# Patient Record
Sex: Male | Born: 1975 | Race: White | Hispanic: No | Marital: Married | Smoking: Former smoker
Health system: Southern US, Community
[De-identification: ages and names within clinical notes are randomized; demographics above are authoritative.]

## PROBLEM LIST (undated history)

## (undated) DIAGNOSIS — T7840XA Allergy, unspecified, initial encounter: Secondary | ICD-10-CM

## (undated) DIAGNOSIS — G4730 Sleep apnea, unspecified: Secondary | ICD-10-CM

## (undated) DIAGNOSIS — K259 Gastric ulcer, unspecified as acute or chronic, without hemorrhage or perforation: Secondary | ICD-10-CM

## (undated) DIAGNOSIS — I10 Essential (primary) hypertension: Secondary | ICD-10-CM

## (undated) DIAGNOSIS — E785 Hyperlipidemia, unspecified: Secondary | ICD-10-CM

## (undated) DIAGNOSIS — R7989 Other specified abnormal findings of blood chemistry: Secondary | ICD-10-CM

## (undated) DIAGNOSIS — J45909 Unspecified asthma, uncomplicated: Secondary | ICD-10-CM

## (undated) HISTORY — PX: BACK SURGERY: SHX140

## (undated) HISTORY — DX: Other specified abnormal findings of blood chemistry: R79.89

## (undated) HISTORY — DX: Gastric ulcer, unspecified as acute or chronic, without hemorrhage or perforation: K25.9

## (undated) HISTORY — DX: Hyperlipidemia, unspecified: E78.5

## (undated) HISTORY — DX: Unspecified asthma, uncomplicated: J45.909

## (undated) HISTORY — DX: Allergy, unspecified, initial encounter: T78.40XA

## (undated) HISTORY — DX: Sleep apnea, unspecified: G47.30

---

## 1999-09-03 ENCOUNTER — Ambulatory Visit (HOSPITAL_COMMUNITY): Admission: RE | Admit: 1999-09-03 | Discharge: 1999-09-03 | Payer: Self-pay | Admitting: Family Medicine

## 1999-09-03 ENCOUNTER — Encounter: Payer: Self-pay | Admitting: Family Medicine

## 1999-09-13 ENCOUNTER — Emergency Department (HOSPITAL_COMMUNITY): Admission: EM | Admit: 1999-09-13 | Discharge: 1999-09-13 | Payer: Self-pay | Admitting: Emergency Medicine

## 1999-09-24 ENCOUNTER — Emergency Department (HOSPITAL_COMMUNITY): Admission: EM | Admit: 1999-09-24 | Discharge: 1999-09-24 | Payer: Self-pay | Admitting: Emergency Medicine

## 1999-11-30 ENCOUNTER — Emergency Department (HOSPITAL_COMMUNITY): Admission: EM | Admit: 1999-11-30 | Discharge: 1999-12-01 | Payer: Self-pay | Admitting: Emergency Medicine

## 2000-04-15 ENCOUNTER — Encounter: Payer: Self-pay | Admitting: Emergency Medicine

## 2000-04-15 ENCOUNTER — Emergency Department (HOSPITAL_COMMUNITY): Admission: EM | Admit: 2000-04-15 | Discharge: 2000-04-15 | Payer: Self-pay | Admitting: Emergency Medicine

## 2000-05-25 ENCOUNTER — Encounter: Payer: Self-pay | Admitting: Emergency Medicine

## 2000-05-25 ENCOUNTER — Emergency Department (HOSPITAL_COMMUNITY): Admission: EM | Admit: 2000-05-25 | Discharge: 2000-05-25 | Payer: Self-pay | Admitting: Emergency Medicine

## 2003-04-16 ENCOUNTER — Inpatient Hospital Stay (HOSPITAL_COMMUNITY): Admission: RE | Admit: 2003-04-16 | Discharge: 2003-04-18 | Payer: Self-pay | Admitting: Neurosurgery

## 2006-04-17 ENCOUNTER — Encounter: Admission: RE | Admit: 2006-04-17 | Discharge: 2006-04-17 | Payer: Self-pay | Admitting: Sports Medicine

## 2006-04-27 ENCOUNTER — Encounter: Admission: RE | Admit: 2006-04-27 | Discharge: 2006-04-27 | Payer: Self-pay | Admitting: Sports Medicine

## 2007-09-14 ENCOUNTER — Encounter (INDEPENDENT_AMBULATORY_CARE_PROVIDER_SITE_OTHER): Payer: Self-pay | Admitting: *Deleted

## 2010-08-09 ENCOUNTER — Encounter: Payer: Self-pay | Admitting: Sports Medicine

## 2010-08-10 ENCOUNTER — Encounter: Payer: Self-pay | Admitting: Sports Medicine

## 2010-12-05 NOTE — Op Note (Signed)
NAME:  Carl Roberts, Carl Roberts                        ACCOUNT NO.:  [id number]   MEDICAL RECORD NO.:  [id number]                   PATIENT TYPE:  OIB   LOCATION:  3012                                 FACILITY:  [facility]   PHYSICIAN:  Cristi Loron, M.D.            DATE OF BIRTH:  October 04, 1975   DATE OF PROCEDURE:  04/16/2003  DATE OF DISCHARGE:                                 OPERATIVE REPORT   PREOPERATIVE DIAGNOSES:  Left L5-S1 herniated nucleus pulposus, spinal  stenosis, lumbar radiculopathy, and lumbago.   POSTOPERATIVE DIAGNOSES:  Left L5-S1 herniated nucleus pulposus, spinal  stenosis, lumbar radiculopathy, and lumbago.   PROCEDURE:  Left L5-S1 microdiskectomy using microdissection.   SURGEON:  Cristi Loron, M.D.   ASSISTANT:  Clydene Fake, M.D.   ANESTHESIA:  General endotracheal.   ESTIMATED BLOOD LOSS:  50 mL.   SPECIMENS:  None.   DRAINS:  None.   COMPLICATIONS:  None.   BRIEF HISTORY:  The patient is a 35 year old white male who was injured in a  work-related injury, suffering back and left leg pain.  He failed medical  management and was worked up with a lumbar MRI, which demonstrated a  herniated disk at L5-S1 on the right.  The patient's signs, symptoms, and  physical exam were consistent with a left S1 radiculopathy.  I discussed the  various treatment options with him, including surgery.  The patient weighed  the risks, benefits, and alternatives of surgery and decided to proceed with  a microdiskectomy.   DESCRIPTION OF PROCEDURE:  The patient was brought to the operating room by  the anesthesia team.  General endotracheal anesthesia was induced.  The  patient was turned to the prone position on the Wilson frame.  His  lumbosacral region was then shaved and prepared with Betadine scrub and  Betadine solution and sterile drapes were applied.  I then injected the area  to be incised with Marcaine with epinephrine solution and then made a linear  midline incision over the L5-S1 interspace.  I used electrocautery to  dissect down to the thoracolumbar fascia and divided the fascia just to the  left of midline, performing a left-sided subperiosteal dissection, stripping  the paraspinous musculature from the left spinous process and lamina of L5  and S1.  I inserted the McCullough retractor for exposure and obtained the  intraoperative radiograph to confirm our location.   We then brought the operating microscope into the field and under its  magnification and illumination completed the microdissection/decompression.  We used the high-speed drill to perform a left L5 laminotomy.  We widened  the laminotomy with the Kerrison punch and removed the left L5-S1 ligamentum  flavum.  We performed a foraminotomy about the left S1 nerve root.  We used  microdissection to free up the nerve root and thecal sac from the epidural  tissue and then Dr. Phoebe Perch gently retracted the nerve root medially  with the  D'Errico retractor.  This exposed a large underlying herniated disk at L5-  S1.  I incised into the herniated disk with the 15 blade scalpel and  performed a partial diskectomy using the pituitary forceps and the Epstein  curettes.  After we were satisfied with the diskectomy and removal of the  herniated part of the disk, we then palpated along the ventral surface of  the thecal sac along the exit route of the S1 nerve root and noted it was  well-decompressed.  We then obtained stringent hemostasis using bipolar  electrocautery and copiously irrigated the wound out with bacitracin  solution, removed the solution, then removed the McCullough retractor and  then reapproximated the patient's thoracolumbar fascia with interrupted #1  Vicryl suture, the subcutaneous tissue with interrupted 2-0 Vicryl suture,  and the skin with Steri-Strips and Benzoin.  The wound was then coated with  bacitracin ointment, a sterile dressing was applied, and the  drapes were  removed.  The patient was subsequently returned to a supine position, where  he was extubated by the anesthesia team and transported to the  postanesthesia care unit in stable condition.  All sponge, instrument, and  needle counts were correct at the end of this case.                                               Cristi Loron, M.D.    JDJ/MEDQ  D:  04/16/2003  T:  04/16/2003  Job:  [id number]

## 2011-04-17 DIAGNOSIS — J45909 Unspecified asthma, uncomplicated: Secondary | ICD-10-CM | POA: Insufficient documentation

## 2011-10-26 DIAGNOSIS — D352 Benign neoplasm of pituitary gland: Secondary | ICD-10-CM | POA: Insufficient documentation

## 2011-10-26 HISTORY — DX: Benign neoplasm of pituitary gland: D35.2

## 2011-10-27 DIAGNOSIS — Z9989 Dependence on other enabling machines and devices: Secondary | ICD-10-CM | POA: Insufficient documentation

## 2011-10-27 DIAGNOSIS — G4733 Obstructive sleep apnea (adult) (pediatric): Secondary | ICD-10-CM | POA: Insufficient documentation

## 2015-01-28 ENCOUNTER — Other Ambulatory Visit: Payer: Self-pay | Admitting: Family Medicine

## 2015-01-28 DIAGNOSIS — R1012 Left upper quadrant pain: Secondary | ICD-10-CM

## 2015-02-01 ENCOUNTER — Ambulatory Visit: Payer: Self-pay

## 2015-02-01 ENCOUNTER — Other Ambulatory Visit: Payer: Self-pay | Admitting: Family Medicine

## 2015-02-01 DIAGNOSIS — R1011 Right upper quadrant pain: Secondary | ICD-10-CM

## 2015-02-04 ENCOUNTER — Ambulatory Visit
Admission: RE | Admit: 2015-02-04 | Discharge: 2015-02-04 | Disposition: A | Discharge status: Home / Self Care | Payer: BLUE CROSS/BLUE SHIELD | Source: Ambulatory Visit | Attending: Family Medicine | Admitting: Family Medicine

## 2015-02-04 DIAGNOSIS — R1011 Right upper quadrant pain: Secondary | ICD-10-CM

## 2015-02-04 DIAGNOSIS — K760 Fatty (change of) liver, not elsewhere classified: Secondary | ICD-10-CM | POA: Insufficient documentation

## 2016-09-07 ENCOUNTER — Encounter: Payer: Self-pay | Admitting: Emergency Medicine

## 2016-09-07 ENCOUNTER — Emergency Department
Admission: EM | Admit: 2016-09-07 | Discharge: 2016-09-07 | Disposition: A | Discharge status: Home / Self Care | Payer: BLUE CROSS/BLUE SHIELD | Attending: Emergency Medicine | Admitting: Emergency Medicine

## 2016-09-07 ENCOUNTER — Emergency Department: Payer: BLUE CROSS/BLUE SHIELD

## 2016-09-07 DIAGNOSIS — I10 Essential (primary) hypertension: Secondary | ICD-10-CM | POA: Diagnosis not present

## 2016-09-07 DIAGNOSIS — R079 Chest pain, unspecified: Secondary | ICD-10-CM

## 2016-09-07 DIAGNOSIS — Z79899 Other long term (current) drug therapy: Secondary | ICD-10-CM | POA: Diagnosis not present

## 2016-09-07 DIAGNOSIS — R202 Paresthesia of skin: Secondary | ICD-10-CM | POA: Insufficient documentation

## 2016-09-07 DIAGNOSIS — R0789 Other chest pain: Secondary | ICD-10-CM | POA: Insufficient documentation

## 2016-09-07 HISTORY — DX: Essential (primary) hypertension: I10

## 2016-09-07 LAB — BASIC METABOLIC PANEL
ANION GAP: 7 (ref 5–15)
BUN: 14 mg/dL (ref 6–20)
CO2: 26 mmol/L (ref 22–32)
Calcium: 9.1 mg/dL (ref 8.9–10.3)
Chloride: 105 mmol/L (ref 101–111)
Creatinine, Ser: 0.78 mg/dL (ref 0.61–1.24)
Glucose, Bld: 87 mg/dL (ref 65–99)
POTASSIUM: 4 mmol/L (ref 3.5–5.1)
SODIUM: 138 mmol/L (ref 135–145)

## 2016-09-07 LAB — CBC
HCT: 43.7 % (ref 40.0–52.0)
Hemoglobin: 14.9 g/dL (ref 13.0–18.0)
MCH: 31.4 pg (ref 26.0–34.0)
MCHC: 34.1 g/dL (ref 32.0–36.0)
MCV: 91.9 fL (ref 80.0–100.0)
PLATELETS: 235 10*3/uL (ref 150–440)
RBC: 4.76 MIL/uL (ref 4.40–5.90)
RDW: 13.9 % (ref 11.5–14.5)
WBC: 5.9 10*3/uL (ref 3.8–10.6)

## 2016-09-07 LAB — TROPONIN I

## 2016-09-07 MED ORDER — NITROGLYCERIN 0.4 MG SL SUBL
SUBLINGUAL_TABLET | SUBLINGUAL | Status: AC
  Filled 2016-09-07: qty 1

## 2016-09-07 MED ORDER — ASPIRIN 81 MG PO CHEW
CHEWABLE_TABLET | ORAL | Status: AC
  Filled 2016-09-07: qty 4

## 2016-09-07 MED ORDER — ASPIRIN 81 MG PO CHEW
324.0000 mg | CHEWABLE_TABLET | Freq: Once | ORAL | Status: AC
  Administered 2016-09-07: 324 mg via ORAL

## 2016-09-07 MED ORDER — NITROGLYCERIN 0.4 MG SL SUBL
0.4000 mg | SUBLINGUAL_TABLET | SUBLINGUAL | Status: DC | PRN
  Administered 2016-09-07: 0.4 mg via SUBLINGUAL

## 2016-09-07 MED ORDER — SODIUM CHLORIDE 0.9 % IV BOLUS (SEPSIS)
1000.0000 mL | Freq: Once | INTRAVENOUS | Status: AC
  Administered 2016-09-07: 1000 mL via INTRAVENOUS

## 2016-09-07 NOTE — ED Triage Notes (Addendum)
Pt presents to ED with sudden onset of mid sternal chest pain that radiates to his left arm since around 0530 this morning. No hx of the same. Denies nasuea and reports slight sob. Pt skin warm and dry. No increased work of breathing or acute distress noted at this time.

## 2016-09-07 NOTE — ED Provider Notes (Signed)
[facility] Emergency Department Provider Note  ____________________________________________  Time seen: Approximately 8:22 AM  I have reviewed the triage vital signs and the nursing notes.   HISTORY  Chief Complaint Chest Pain and Arm Pain   HPI Carl Roberts is a 42 y.o. male a history of hypertension presents for evaluation of chest pain. Patient reports that he got in the shower at 5 AM this morning when he started having left arm tingling and then developed pressure on his chest that he describes as mild 4/10, it is centrally nonradiating. Patient denies shortness of breath, dizziness, URI symptoms, nausea, vomiting. Patient reports that he works out frequently and usually does not have chest pain however yesterday when he was running he felt more out of breath than normal. He has family history of ischemic heart disease in his maternal side. Has history of former smoking. Patient does not take any medications at home as he has been cleared from his hypertension after losing some weight.  Past Medical History:  Diagnosis Date  . Hypertension     There are no active problems to display for this patient.   Past Surgical History:  Procedure Laterality Date  . BACK SURGERY      Prior to Admission medications   Medication Sig Start Date End Date Taking? Authorizing Provider  testosterone cypionate (DEPOTESTOSTERONE CYPIONATE) 200 MG/ML injection Inject 200 mg into the muscle every 14 (fourteen) days. 12/25/15  Yes Historical Provider, MD    Allergies Shellfish allergy  No family history on file.  Social History Social History  Substance Use Topics  . Smoking status: Never Smoker  . Smokeless tobacco: Never Used  . Alcohol use Yes    Review of Systems  Constitutional: Negative for fever. Eyes: Negative for visual changes. ENT: Negative for sore throat. Neck: No neck pain  Cardiovascular: +chest pain. Respiratory: Negative for shortness  of breath. Gastrointestinal: Negative for abdominal pain, vomiting or diarrhea. Genitourinary: Negative for dysuria. Musculoskeletal: Negative for back pain. + L arm tingling Skin: Negative for rash. Neurological: Negative for headaches, weakness or numbness. Psych: No SI or HI  ____________________________________________   PHYSICAL EXAM:  VITAL SIGNS: ED Triage Vitals  Enc Vitals Group     BP 09/07/16 0704 138/87     Pulse Rate 09/07/16 0704 (!) 53     Resp 09/07/16 0704 18     Temp 09/07/16 0704 98 F (36.7 C)     Temp Source 09/07/16 0704 Oral     SpO2 09/07/16 0704 100 %     Weight 09/07/16 0702 222 lb (100.7 kg)     Height 09/07/16 0702 6\' 2"  (1.88 m)     Head Circumference --      Peak Flow --      Pain Score 09/07/16 0702 0     Pain Loc --      Pain Edu? --      Excl. in [place]? --     Constitutional: Alert and oriented. Well appearing and in no apparent distress. HEENT:      Head: Normocephalic and atraumatic.         Eyes: Conjunctivae are normal. Sclera is non-icteric. EOMI. PERRL      Mouth/Throat: Mucous membranes are moist.       Neck: Supple with no signs of meningismus. Cardiovascular: Regular rate and rhythm. No murmurs, gallops, or rubs. 2+ symmetrical distal pulses are present in all extremities. No JVD. Respiratory: Normal respiratory effort. Lungs are clear  to auscultation bilaterally. No wheezes, crackles, or rhonchi.  Gastrointestinal: Soft, non tender, and non distended with positive bowel sounds. No rebound or guarding. Genitourinary: No CVA tenderness. Musculoskeletal: Nontender with normal range of motion in all extremities. No edema, cyanosis, or erythema of extremities. Neurologic: Normal speech and language. Face is symmetric. Moving all extremities. No gross focal neurologic deficits are appreciated. Skin: Skin is warm, dry and intact. No rash noted. Psychiatric: Mood and affect are normal. Speech and behavior are  normal.  ____________________________________________   LABS (all labs ordered are listed, but only abnormal results are displayed)  Labs Reviewed  CBC  BASIC METABOLIC PANEL  TROPONIN I  TROPONIN I   ____________________________________________  EKG  ED ECG REPORT I, Rudene Re, the attending physician, personally viewed and interpreted this ECG.  Sinus bradycardia, rate of 51, normal intervals, normal axis, no ST elevations or depressions.  ____________________________________________  RADIOLOGY  CXR: negative ____________________________________________   PROCEDURES  Procedure(s) performed: None Procedures Critical Care performed:  None ____________________________________________   INITIAL IMPRESSION / ASSESSMENT AND PLAN / ED COURSE  41 y.o. male a history of hypertension presents for evaluation of chest pressure 88 into his left arm that has been present since 5 AM this morning.  Chest pain in a 41 y.o. male with low suspicion for cardiac (HEART score 3) or other serious etiology (including aortic dissection, pneumonia, pneumothorax, or pulmonary embolism) based his history and physical exam in the ED today. EKG normal. Plan for labs including CBC, chemistries and troponin now and in 3 hours, CXR and re-evaluation for disposition. Will give full dose ASA and nitroglycerine. Will observe patient on cardiac monitor while in the ED and pain control.       _________________________ 12:41 PM on 09/07/2016 -----------------------------------------  Patient remained pain-free in the emergency department after receiving one sublingual nitroglycerin. Troponin 2 is negative. I was able to schedule an appointment with [facility], Cardiologist on call tomorrow at 9:30AM For close follow-up. Recommended the patient returns to the emergency room if the chest pain recurs. Patient is comfortable with the plan and will follow-up tomorrow. Patient be discharged at this  time.  Pertinent labs & imaging results that were available during my care of the patient were reviewed by me and considered in my medical decision making (see chart for details).    ____________________________________________   FINAL CLINICAL IMPRESSION(S) / ED DIAGNOSES  Final diagnoses:  Chest pain, unspecified type      NEW MEDICATIONS STARTED DURING THIS VISIT:  New Prescriptions   No medications on file     Note:  This document was prepared using Dragon voice recognition software and may include unintentional dictation errors.    Rudene Re, MD 09/07/16 1242

## 2016-09-07 NOTE — Discharge Instructions (Signed)

## 2016-09-08 DIAGNOSIS — E782 Mixed hyperlipidemia: Secondary | ICD-10-CM | POA: Insufficient documentation

## 2017-01-07 DIAGNOSIS — M9902 Segmental and somatic dysfunction of thoracic region: Secondary | ICD-10-CM | POA: Diagnosis not present

## 2017-01-07 DIAGNOSIS — M9903 Segmental and somatic dysfunction of lumbar region: Secondary | ICD-10-CM | POA: Diagnosis not present

## 2017-01-07 DIAGNOSIS — M546 Pain in thoracic spine: Secondary | ICD-10-CM | POA: Diagnosis not present

## 2017-01-07 DIAGNOSIS — M545 Low back pain: Secondary | ICD-10-CM | POA: Diagnosis not present

## 2017-05-10 DIAGNOSIS — M9903 Segmental and somatic dysfunction of lumbar region: Secondary | ICD-10-CM | POA: Diagnosis not present

## 2017-05-10 DIAGNOSIS — M9902 Segmental and somatic dysfunction of thoracic region: Secondary | ICD-10-CM | POA: Diagnosis not present

## 2017-05-10 DIAGNOSIS — M545 Low back pain: Secondary | ICD-10-CM | POA: Diagnosis not present

## 2017-05-10 DIAGNOSIS — M546 Pain in thoracic spine: Secondary | ICD-10-CM | POA: Diagnosis not present

## 2017-05-31 DIAGNOSIS — M5416 Radiculopathy, lumbar region: Secondary | ICD-10-CM | POA: Diagnosis not present

## 2017-05-31 DIAGNOSIS — M9905 Segmental and somatic dysfunction of pelvic region: Secondary | ICD-10-CM | POA: Diagnosis not present

## 2017-05-31 DIAGNOSIS — M9903 Segmental and somatic dysfunction of lumbar region: Secondary | ICD-10-CM | POA: Diagnosis not present

## 2017-05-31 DIAGNOSIS — M62830 Muscle spasm of back: Secondary | ICD-10-CM | POA: Diagnosis not present

## 2017-06-02 DIAGNOSIS — M9905 Segmental and somatic dysfunction of pelvic region: Secondary | ICD-10-CM | POA: Diagnosis not present

## 2017-06-02 DIAGNOSIS — M9903 Segmental and somatic dysfunction of lumbar region: Secondary | ICD-10-CM | POA: Diagnosis not present

## 2017-06-02 DIAGNOSIS — M62830 Muscle spasm of back: Secondary | ICD-10-CM | POA: Diagnosis not present

## 2017-06-02 DIAGNOSIS — M5416 Radiculopathy, lumbar region: Secondary | ICD-10-CM | POA: Diagnosis not present

## 2017-06-17 DIAGNOSIS — M5416 Radiculopathy, lumbar region: Secondary | ICD-10-CM | POA: Diagnosis not present

## 2017-06-17 DIAGNOSIS — M62830 Muscle spasm of back: Secondary | ICD-10-CM | POA: Diagnosis not present

## 2017-06-17 DIAGNOSIS — M9903 Segmental and somatic dysfunction of lumbar region: Secondary | ICD-10-CM | POA: Diagnosis not present

## 2017-06-17 DIAGNOSIS — M9905 Segmental and somatic dysfunction of pelvic region: Secondary | ICD-10-CM | POA: Diagnosis not present

## 2017-07-06 DIAGNOSIS — Z23 Encounter for immunization: Secondary | ICD-10-CM | POA: Diagnosis not present

## 2017-07-06 DIAGNOSIS — E291 Testicular hypofunction: Secondary | ICD-10-CM | POA: Diagnosis not present

## 2017-10-06 DIAGNOSIS — M6289 Other specified disorders of muscle: Secondary | ICD-10-CM | POA: Diagnosis not present

## 2017-11-16 DIAGNOSIS — Z125 Encounter for screening for malignant neoplasm of prostate: Secondary | ICD-10-CM | POA: Diagnosis not present

## 2017-11-16 DIAGNOSIS — Z91013 Allergy to seafood: Secondary | ICD-10-CM | POA: Diagnosis not present

## 2017-11-16 DIAGNOSIS — Z0000 Encounter for general adult medical examination without abnormal findings: Secondary | ICD-10-CM | POA: Diagnosis not present

## 2017-11-16 DIAGNOSIS — E291 Testicular hypofunction: Secondary | ICD-10-CM | POA: Diagnosis not present

## 2017-11-16 LAB — LIPID PANEL
Cholesterol: 187 (ref 0–200)
HDL: 119 — AB (ref 35–70)
LDL Cholesterol: 122

## 2017-11-30 DIAGNOSIS — H5213 Myopia, bilateral: Secondary | ICD-10-CM | POA: Diagnosis not present

## 2018-03-22 DIAGNOSIS — S40862A Insect bite (nonvenomous) of left upper arm, initial encounter: Secondary | ICD-10-CM | POA: Diagnosis not present

## 2018-03-22 DIAGNOSIS — W57XXXA Bitten or stung by nonvenomous insect and other nonvenomous arthropods, initial encounter: Secondary | ICD-10-CM | POA: Diagnosis not present

## 2018-03-28 DIAGNOSIS — S60562A Insect bite (nonvenomous) of left hand, initial encounter: Secondary | ICD-10-CM | POA: Diagnosis not present

## 2018-03-28 DIAGNOSIS — S00462A Insect bite (nonvenomous) of left ear, initial encounter: Secondary | ICD-10-CM | POA: Diagnosis not present

## 2018-03-28 DIAGNOSIS — R21 Rash and other nonspecific skin eruption: Secondary | ICD-10-CM | POA: Diagnosis not present

## 2018-03-28 DIAGNOSIS — W57XXXA Bitten or stung by nonvenomous insect and other nonvenomous arthropods, initial encounter: Secondary | ICD-10-CM | POA: Diagnosis not present

## 2018-07-28 ENCOUNTER — Ambulatory Visit: Payer: BLUE CROSS/BLUE SHIELD | Admitting: Family Medicine

## 2018-08-03 ENCOUNTER — Ambulatory Visit (INDEPENDENT_AMBULATORY_CARE_PROVIDER_SITE_OTHER): Payer: BLUE CROSS/BLUE SHIELD | Admitting: Family Medicine

## 2018-08-03 ENCOUNTER — Encounter: Payer: Self-pay | Admitting: Family Medicine

## 2018-08-03 VITALS — BP 148/94 | HR 67 | Temp 97.6°F | Ht 72.25 in | Wt 278.0 lb

## 2018-08-03 DIAGNOSIS — G4733 Obstructive sleep apnea (adult) (pediatric): Secondary | ICD-10-CM

## 2018-08-03 DIAGNOSIS — Z23 Encounter for immunization: Secondary | ICD-10-CM | POA: Diagnosis not present

## 2018-08-03 DIAGNOSIS — Z9989 Dependence on other enabling machines and devices: Secondary | ICD-10-CM

## 2018-08-03 DIAGNOSIS — R030 Elevated blood-pressure reading, without diagnosis of hypertension: Secondary | ICD-10-CM | POA: Diagnosis not present

## 2018-08-03 DIAGNOSIS — E291 Testicular hypofunction: Secondary | ICD-10-CM | POA: Diagnosis not present

## 2018-08-03 MED ORDER — TESTOSTERONE CYPIONATE 200 MG/ML IM SOLN: 200.0000 mg | INTRAMUSCULAR | 1 refills | Status: DC

## 2018-08-03 NOTE — Patient Instructions (Addendum)
# Elevated Blood pressure - check your blood pressure at home a 1-2 times a week - start the DASH diet - Make sure you have been sitting for at least 5 minutes with feet flat on the ground    DASH Eating Plan DASH stands for "Dietary Approaches to Stop Hypertension." The DASH eating plan is a healthy eating plan that has been shown to reduce high blood pressure (hypertension). It may also reduce your risk for type 2 diabetes, heart disease, and stroke. The DASH eating plan may also help with weight loss. What are tips for following this plan?  General guidelines  Avoid eating more than 2,300 mg (milligrams) of salt (sodium) a day. If you have hypertension, you may need to reduce your sodium intake to 1,500 mg a day.  Limit alcohol intake to no more than 1 drink a day for nonpregnant women and 2 drinks a day for men. One drink equals 12 oz of beer, 5 oz of wine, or 1 oz of hard liquor.  Work with your health care provider to maintain a healthy body weight or to lose weight. Ask what an ideal weight is for you.  Get at least 30 minutes of exercise that causes your heart to beat faster (aerobic exercise) most days of the week. Activities may include walking, swimming, or biking.  Work with your health care provider or diet and nutrition specialist (dietitian) to adjust your eating plan to your individual calorie needs. Reading food labels   Check food labels for the amount of sodium per serving. Choose foods with less than 5 percent of the Daily Value of sodium. Generally, foods with less than 300 mg of sodium per serving fit into this eating plan.  To find whole grains, look for the word "whole" as the first word in the ingredient list. Shopping  Buy products labeled as "low-sodium" or "no salt added."  Buy fresh foods. Avoid canned foods and premade or frozen meals. Cooking  Avoid adding salt when cooking. Use salt-free seasonings or herbs instead of table salt or sea salt. Check  with your health care provider or pharmacist before using salt substitutes.  Do not fry foods. Cook foods using healthy methods such as baking, boiling, grilling, and broiling instead.  Cook with heart-healthy oils, such as olive, canola, soybean, or sunflower oil. Meal planning  Eat a balanced diet that includes: ? 5 or more servings of fruits and vegetables each day. At each meal, try to fill half of your plate with fruits and vegetables. ? Up to 6-8 servings of whole grains each day. ? Less than 6 oz of lean meat, poultry, or fish each day. A 3-oz serving of meat is about the same size as a deck of cards. One egg equals 1 oz. ? 2 servings of low-fat dairy each day. ? A serving of nuts, seeds, or beans 5 times each week. ? Heart-healthy fats. Healthy fats called Omega-3 fatty acids are found in foods such as flaxseeds and coldwater fish, like sardines, salmon, and mackerel.  Limit how much you eat of the following: ? Canned or prepackaged foods. ? Food that is high in trans fat, such as fried foods. ? Food that is high in saturated fat, such as fatty meat. ? Sweets, desserts, sugary drinks, and other foods with added sugar. ? Full-fat dairy products.  Do not salt foods before eating.  Try to eat at least 2 vegetarian meals each week.  Eat more home-cooked food and less restaurant,  buffet, and fast food.  When eating at a restaurant, ask that your food be prepared with less salt or no salt, if possible. What foods are recommended? The items listed may not be a complete list. Talk with your dietitian about what dietary choices are best for you. Grains Whole-grain or whole-wheat bread. Whole-grain or whole-wheat pasta. Brown rice. [place] Morrow. Bulgur. Whole-grain and low-sodium cereals. Pita bread. Low-fat, low-sodium crackers. Whole-wheat flour tortillas. Vegetables Fresh or frozen vegetables (raw, steamed, roasted, or grilled). Low-sodium or reduced-sodium tomato and vegetable  juice. Low-sodium or reduced-sodium tomato sauce and tomato paste. Low-sodium or reduced-sodium canned vegetables. Fruits All fresh, dried, or frozen fruit. Canned fruit in natural juice (without added sugar). Meat and other protein foods Skinless chicken or [place]. Ground chicken or [place]. Pork with fat trimmed off. Fish and seafood. Egg whites. Dried beans, peas, or lentils. Unsalted nuts, nut butters, and seeds. Unsalted canned beans. Lean cuts of beef with fat trimmed off. Low-sodium, lean deli meat. Dairy Low-fat (1%) or fat-free (skim) milk. Fat-free, low-fat, or reduced-fat cheeses. Nonfat, low-sodium ricotta or cottage cheese. Low-fat or nonfat yogurt. Low-fat, low-sodium cheese. Fats and oils Soft margarine without trans fats. Vegetable oil. Low-fat, reduced-fat, or light mayonnaise and salad dressings (reduced-sodium). Canola, safflower, olive, soybean, and sunflower oils. Avocado. Seasoning and other foods Herbs. Spices. Seasoning mixes without salt. Unsalted popcorn and pretzels. Fat-free sweets. What foods are not recommended? The items listed may not be a complete list. Talk with your dietitian about what dietary choices are best for you. Grains Baked goods made with fat, such as croissants, muffins, or some breads. Dry pasta or rice meal packs. Vegetables Creamed or fried vegetables. Vegetables in a cheese sauce. Regular canned vegetables (not low-sodium or reduced-sodium). Regular canned tomato sauce and paste (not low-sodium or reduced-sodium). Regular tomato and vegetable juice (not low-sodium or reduced-sodium). Angie Fava. Olives. Fruits Canned fruit in a light or heavy syrup. Fried fruit. Fruit in cream or butter sauce. Meat and other protein foods Fatty cuts of meat. Ribs. Fried meat. Berniece Salines. Sausage. Bologna and other processed lunch meats. Salami. Fatback. Hotdogs. Bratwurst. Salted nuts and seeds. Canned beans with added salt. Canned or smoked fish. Whole eggs or egg yolks.  Chicken or [place] with skin. Dairy Whole or 2% milk, cream, and half-and-half. Whole or full-fat cream cheese. Whole-fat or sweetened yogurt. Full-fat cheese. Nondairy creamers. Whipped toppings. Processed cheese and cheese spreads. Fats and oils Butter. Stick margarine. Lard. Shortening. Ghee. Bacon fat. Tropical oils, such as coconut, palm kernel, or palm oil. Seasoning and other foods Salted popcorn and pretzels. Onion salt, garlic salt, seasoned salt, table salt, and sea salt. Worcestershire sauce. Tartar sauce. Barbecue sauce. Teriyaki sauce. Soy sauce, including reduced-sodium. Steak sauce. Canned and packaged gravies. Fish sauce. Oyster sauce. Cocktail sauce. Horseradish that you find on the shelf. Ketchup. Mustard. Meat flavorings and tenderizers. Bouillon cubes. Hot sauce and Tabasco sauce. Premade or packaged marinades. Premade or packaged taco seasonings. Relishes. Regular salad dressings. Where to find more information:  National Heart, Lung, and [place]: https://wilson-eaton.com/  American Heart Association: www.heart.org Summary  The DASH eating plan is a healthy eating plan that has been shown to reduce high blood pressure (hypertension). It may also reduce your risk for type 2 diabetes, heart disease, and stroke.  With the DASH eating plan, you should limit salt (sodium) intake to 2,300 mg a day. If you have hypertension, you may need to reduce your sodium intake to 1,500 mg a day.  When  on the DASH eating plan, aim to eat more fresh fruits and vegetables, whole grains, lean proteins, low-fat dairy, and heart-healthy fats.  Work with your health care provider or diet and nutrition specialist (dietitian) to adjust your eating plan to your individual calorie needs. This information is not intended to replace advice given to you by your health care provider. Make sure you discuss any questions you have with your health care provider. Document Released: 06/25/2011 Document Revised:  06/29/2016 Document Reviewed: 06/29/2016 Elsevier Interactive Patient Education  2019 [employer].

## 2018-08-03 NOTE — Assessment & Plan Note (Signed)
Has not used machine in 1.5 years. Advised restarting to see if that improves BP.

## 2018-08-03 NOTE — Progress Notes (Signed)
Subjective:     Carl Roberts is a 43 y.o. male presenting for Establish Care (previous PCP at [facility]. And sees chiropractor.) and Lab work (needs testosterone levels and other labs checked. needs refill on Testosterone medication. )     HPI   # Low testosterone - initially presented with severe sleepiness - would sit down and fall asleep - noticed his mood not being right - both brothers with low testosterone - tried patch and gels, but only the shot seems to work - has been on medication since 2008 - also has sleep apnea  - tested several times for low T before starting therapy  Was initially 331 lbs and was diagnosed with sleep apnea Has not used CPAP for 1.5 years Did a home sleep study  Initially lost 100 lbs and then regained about 25 lbs  # Elevated blood pressure - checked bp this morning and it was elevated as well - resting hr usually 51    Review of Systems  Constitutional: Negative for chills and fever.  Eyes: Negative for visual disturbance.  Respiratory: Negative for chest tightness and shortness of breath.   Cardiovascular: Negative for chest pain, palpitations and leg swelling.  Genitourinary: Negative for scrotal swelling and testicular pain.  Neurological: Negative for headaches.     Social History   Tobacco Use  Smoking Status Former Smoker  . Packs/day: 0.50  . Years: 4.00  . Pack years: 2.00  . Types: Cigarettes, Pipe  . Last attempt to quit: 08/03/1998  . Years since quitting: 20.0  Smokeless Tobacco Never Used        Objective:    BP Readings from Last 3 Encounters:  08/03/18 (!) 148/94  09/07/16 138/79   Wt Readings from Last 3 Encounters:  08/03/18 278 lb (126.1 kg)  09/07/16 222 lb (100.7 kg)    BP (!) 148/94   Pulse 67   Temp 97.6 F (36.4 C)   Ht 6' 0.25" (1.835 m)   Wt 278 lb (126.1 kg)   SpO2 97%   BMI 37.44 kg/m    Physical Exam Constitutional:      Appearance: Normal appearance. He is not  ill-appearing or diaphoretic.  HENT:     Right Ear: External ear normal.     Left Ear: External ear normal.     Nose: Nose normal.  Eyes:     General: No scleral icterus.    Extraocular Movements: Extraocular movements intact.     Conjunctiva/sclera: Conjunctivae normal.  Neck:     Musculoskeletal: Neck supple.  Cardiovascular:     Rate and Rhythm: Normal rate and regular rhythm.     Heart sounds: No murmur.  Pulmonary:     Effort: Pulmonary effort is normal. No respiratory distress.     Breath sounds: Normal breath sounds.  Musculoskeletal:     Right lower leg: No edema.     Left lower leg: No edema.  Skin:    General: Skin is warm and dry.  Neurological:     Mental Status: He is alert. Mental status is at baseline.  Psychiatric:        Mood and Affect: Mood normal.           Assessment & Plan:   Problem List Items Addressed This Visit      Respiratory   OSA on CPAP    Has not used machine in 1.5 years. Advised restarting to see if that improves BP.  Endocrine   Hypogonadism in male    Briefly discussed risks of testosterone supplement include CV risk. Now with new elevated BP. Previous labs were well controlled and last lipids were normal. Repeat labs next week - midcycle. Will continue to discussed. Diagnosed and treated by urology initially 12 years ago.       Relevant Medications   testosterone cypionate (DEPOTESTOSTERONE CYPIONATE) 200 MG/ML injection   Other Relevant Orders   Testosterone   CBC     Other   Elevated blood pressure reading in office without diagnosis of hypertension - Primary    BP elevated today, first occurrence. Discussed that this could be related to the testosterone or poor adherence to CPAP. Advised restarting CPAP, home monitoring, DASH diet. Return in 3 months      Relevant Orders   Comprehensive metabolic panel       Return in about 3 months (around 11/02/2018) for bp check.  Lesleigh Noe, MD

## 2018-08-03 NOTE — Assessment & Plan Note (Signed)
Briefly discussed risks of testosterone supplement include CV risk. Now with new elevated BP. Previous labs were well controlled and last lipids were normal. Repeat labs next week - midcycle. Will continue to discussed. Diagnosed and treated by urology initially 12 years ago.

## 2018-08-03 NOTE — Assessment & Plan Note (Signed)
BP elevated today, first occurrence. Discussed that this could be related to the testosterone or poor adherence to CPAP. Advised restarting CPAP, home monitoring, DASH diet. Return in 3 months

## 2018-08-05 ENCOUNTER — Telehealth: Payer: Self-pay

## 2018-08-05 NOTE — Telephone Encounter (Signed)
Submitted PA for testosterone injections today, waiting for response. Also faxed over request to Dr. Claudell Kyle PCP- to get Testosterone levels from 2015 and on. Not able to see this in epic.

## 2018-08-10 NOTE — Telephone Encounter (Signed)
PA approved. Dates of coverage are 08/05/2018-07/19/2038.

## 2018-08-30 DIAGNOSIS — M25562 Pain in left knee: Secondary | ICD-10-CM | POA: Diagnosis not present

## 2018-10-13 ENCOUNTER — Encounter: Payer: Self-pay | Admitting: Family Medicine

## 2018-10-13 ENCOUNTER — Other Ambulatory Visit: Payer: Self-pay

## 2018-10-13 ENCOUNTER — Ambulatory Visit (INDEPENDENT_AMBULATORY_CARE_PROVIDER_SITE_OTHER): Payer: BLUE CROSS/BLUE SHIELD | Admitting: Family Medicine

## 2018-10-13 DIAGNOSIS — L249 Irritant contact dermatitis, unspecified cause: Secondary | ICD-10-CM | POA: Diagnosis not present

## 2018-10-13 NOTE — Patient Instructions (Signed)
1) Lotion (thick cream) regularly 2) Vaseline at night 3) Hydrocortisone cream 4) consider stronger steroid if no improvement

## 2018-10-13 NOTE — Progress Notes (Signed)
Virtual Visit via Video Note  I connected with Carl Roberts on 10/13/18 at 11:40 AM EDT by video and verified that I am speaking with the correct person using two identifiers.   I discussed the limitations, risks, security and privacy concerns of performing an evaluation and management service by video and the availability of in person appointments. I also discussed with the patient that there may be a patient responsible charge related to this service. The patient expressed understanding and agreed to proceed.  Patient location: Work Provider Location: [place] Participants: Lesleigh Noe and Carl Roberts   History of Present Illness: #Rash - On hands  - initially where his smart watch was - not sure if it is from over-washing - tops of hands are very dry and cracked - not itchy currently, but has been itching - using lotion - hand cream for cracked - no fever/chills   Observations/Objective: Erythematous rash - with some scabbing and raised lesion  Assessment and Plan: Problem List Items Addressed This Visit      Musculoskeletal and Integument   Irritant dermatitis - Primary    Suspect dermatitis from frequent handwashing/dry skin. Start with regular hand cream/lotion, Vaseline w/ gloves at night, over the counter hydrocortisone cream if not improving. MyChart in 2 weeks if still not improving and consider stronger steroid cream.           Follow Up Instructions:  Return in about 2 weeks (around 10/27/2018) for via MyChart or web visit.   I discussed the assessment and treatment plan with the patient. The patient was provided an opportunity to ask questions and all were answered. The patient agreed with the plan and demonstrated an understanding of the instructions.   The patient was advised to call back or seek an in-person evaluation if the symptoms worsen or if the condition fails to improve as anticipated.  WebEX meeting was 11:40 to 11:55  am   Lesleigh Noe, MD

## 2018-10-13 NOTE — Assessment & Plan Note (Signed)
Suspect dermatitis from frequent handwashing/dry skin. Start with regular hand cream/lotion, Vaseline w/ gloves at night, over the counter hydrocortisone cream if not improving. MyChart in 2 weeks if still not improving and consider stronger steroid cream.

## 2018-11-03 DIAGNOSIS — Z713 Dietary counseling and surveillance: Secondary | ICD-10-CM | POA: Diagnosis not present

## 2018-11-10 DIAGNOSIS — Z713 Dietary counseling and surveillance: Secondary | ICD-10-CM | POA: Diagnosis not present

## 2018-11-17 DIAGNOSIS — Z713 Dietary counseling and surveillance: Secondary | ICD-10-CM | POA: Diagnosis not present

## 2018-11-24 DIAGNOSIS — Z713 Dietary counseling and surveillance: Secondary | ICD-10-CM | POA: Diagnosis not present

## 2018-12-01 DIAGNOSIS — Z713 Dietary counseling and surveillance: Secondary | ICD-10-CM | POA: Diagnosis not present

## 2018-12-08 DIAGNOSIS — Z713 Dietary counseling and surveillance: Secondary | ICD-10-CM | POA: Diagnosis not present

## 2018-12-15 ENCOUNTER — Encounter: Payer: Self-pay | Admitting: Family Medicine

## 2018-12-15 ENCOUNTER — Other Ambulatory Visit: Payer: Self-pay

## 2018-12-15 ENCOUNTER — Ambulatory Visit (INDEPENDENT_AMBULATORY_CARE_PROVIDER_SITE_OTHER): Payer: BLUE CROSS/BLUE SHIELD | Admitting: Family Medicine

## 2018-12-15 VITALS — Wt 272.0 lb

## 2018-12-15 DIAGNOSIS — L2481 Irritant contact dermatitis due to metals: Secondary | ICD-10-CM | POA: Diagnosis not present

## 2018-12-15 DIAGNOSIS — Z713 Dietary counseling and surveillance: Secondary | ICD-10-CM | POA: Diagnosis not present

## 2018-12-15 MED ORDER — TRIAMCINOLONE ACETONIDE 0.1 % EX CREA: 1.0000 "application " | TOPICAL_CREAM | Freq: Two times a day (BID) | CUTANEOUS | 0 refills | Status: DC

## 2018-12-15 NOTE — Progress Notes (Signed)
I connected with Carl Roberts on 12/15/18 at  2:00 PM EDT by video and verified that I am speaking with the correct person using two identifiers.   I discussed the limitations, risks, security and privacy concerns of performing an evaluation and management service by video and the availability of in person appointments. I also discussed with the patient that there may be a patient responsible charge related to this service. The patient expressed understanding and agreed to proceed.  Patient location: parked car Provider Location: [place] Participants: Lesleigh Noe and Carl Roberts   Subjective:     Carl Roberts is a 43 y.o. male presenting for Rash (on wrists and between fingers. Symptoms present x 2 weeks. Itching present. )     HPI   #Rash on wrist - thinks it may be due to his watch - tried cortisone cream which dried it out - trying to get the itching to stop - has been doing lotion and then tried antibiotic cream - garmen watch - has been using the same watch for 3 years - thinks sweat got trapped and caused the rash - has happened in the past and typically unilateral with resolution by switching side of his watch.    Review of Systems  Constitutional: Negative for chills and fever.  Skin: Positive for rash.     Social History   Tobacco Use  Smoking Status Former Smoker  . Packs/day: 0.50  . Years: 4.00  . Pack years: 2.00  . Types: Cigarettes, Pipe  . Last attempt to quit: 08/03/1998  . Years since quitting: 20.3  Smokeless Tobacco Never Used        Objective:   BP Readings from Last 3 Encounters:  08/03/18 (!) 148/94  09/07/16 138/79   Wt Readings from Last 3 Encounters:  12/15/18 272 lb (123.4 kg)  08/03/18 278 lb (126.1 kg)  09/07/16 222 lb (100.7 kg)   Wt 272 lb (123.4 kg) Comment: per patient  BMI 36.63 kg/m    Physical Exam Constitutional:      Appearance: Normal appearance. He is not ill-appearing.  HENT:      Head: Normocephalic and atraumatic.     Right Ear: External ear normal.     Left Ear: External ear normal.  Eyes:     Conjunctiva/sclera: Conjunctivae normal.  Pulmonary:     Effort: Pulmonary effort is normal. No respiratory distress.  Skin:    Comments: Bilateral wrists with erythematous rash. Raised macules as well as some scabbed lesions and ?papules (limited by virtual image).   Neurological:     Mental Status: He is alert. Mental status is at baseline.  Psychiatric:        Mood and Affect: Mood normal.        Behavior: Behavior normal.        Thought Content: Thought content normal.        Judgment: Judgment normal.             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 Though with history will try higher dose steroid first. If no improvement may treat as suspected scabies. Pt will also try to send photo via mychart to increase quality of image.  Relevant Medications   triamcinolone cream (KENALOG) 0.1 %       Return if symptoms worsen or fail to improve.  Lesleigh Noe, MD

## 2018-12-15 NOTE — Assessment & Plan Note (Signed)
Discussed that it could also be scabies given location and now bilateral location. Though with history will try higher dose steroid first. If no improvement may treat as suspected scabies. Pt will also try to send photo via mychart to increase quality of image.

## 2018-12-22 DIAGNOSIS — Z713 Dietary counseling and surveillance: Secondary | ICD-10-CM | POA: Diagnosis not present

## 2018-12-31 ENCOUNTER — Other Ambulatory Visit: Payer: Self-pay | Admitting: Family Medicine

## 2018-12-31 DIAGNOSIS — L2481 Irritant contact dermatitis due to metals: Secondary | ICD-10-CM

## 2019-01-04 DIAGNOSIS — Z713 Dietary counseling and surveillance: Secondary | ICD-10-CM | POA: Diagnosis not present

## 2019-01-12 DIAGNOSIS — Z713 Dietary counseling and surveillance: Secondary | ICD-10-CM | POA: Diagnosis not present

## 2019-01-25 ENCOUNTER — Ambulatory Visit (INDEPENDENT_AMBULATORY_CARE_PROVIDER_SITE_OTHER): Payer: BC Managed Care – PPO | Admitting: Internal Medicine

## 2019-01-25 ENCOUNTER — Encounter: Payer: Self-pay | Admitting: Internal Medicine

## 2019-01-25 DIAGNOSIS — Z20828 Contact with and (suspected) exposure to other viral communicable diseases: Secondary | ICD-10-CM | POA: Diagnosis not present

## 2019-01-25 DIAGNOSIS — J988 Other specified respiratory disorders: Secondary | ICD-10-CM | POA: Diagnosis not present

## 2019-01-25 NOTE — Assessment & Plan Note (Signed)
His symptoms are certainly concerning for COVID Mild lower respiratory involvement---slight wheezing (not new for him but hasn't had it for a while) No difficulty breathing and he feels okay--just trying to be safe and prudent Will set up testing Supportive care Discussed emergency evaluation if breathing becomes labored Out of work for at least another week and 3 days of no symptom--or till COVID test negative

## 2019-01-25 NOTE — Progress Notes (Signed)
Subjective:    Patient ID: Carl Roberts, male    DOB: 05/03/1976, 43 y.o.   MRN: [id number]  HPI Virtual visit due to respiratory symptoms Identification done Billing reviewed and he gave consent  He is at home and I am in my office  Having some "minor" symptoms Rhinorrhea a few days ago when in the [place] Was camping in [place] exposed to one other family who was not new to her Now has some chest drainage and mild cough Heard some wheezing last night and this morning Had some fatigue 2 days ago--but may have been because of lacking coffee  No fever Some loose stools  Some possible exposure at work--but they do wear face masks there Does construction work--works outside  Current Outpatient Medications on File Prior to Visit  Medication Sig Dispense Refill  . testosterone cypionate (DEPOTESTOSTERONE CYPIONATE) 200 MG/ML injection Inject 1 mL (200 mg total) into the muscle every 14 (fourteen) days. 10 mL 1  . triamcinolone cream (KENALOG) 0.1 % APPLY TO AFFECTED AREA TWICE A DAY 30 g 0   No current facility-administered medications on file prior to visit.     Allergies  Allergen Reactions  . Naproxen Shortness Of Breath  . Shellfish Allergy Other (See Comments)    Past Medical History:  Diagnosis Date  . Asthma   . Hyperlipidemia   . Hypertension   . Low testosterone   . Stomach ulcer     Past Surgical History:  Procedure Laterality Date  . BACK SURGERY      Family History  Problem Relation Age of Onset  . Arthritis Mother   . Diabetes Mother   . Arthritis Father   . Glaucoma Father   . Asthma Maternal Grandmother   . Lung cancer Maternal Grandmother   . COPD Maternal Grandfather   . Heart disease Maternal Grandfather   . Hyperlipidemia Maternal Grandfather   . Heart attack Maternal Grandfather 55  . Alzheimer's disease Paternal Grandmother   . Parkinson's disease Paternal Grandfather   . Heart attack Paternal Grandfather 98  . Allergies  Son   . Hypertension Brother   . Hyperlipidemia Brother     Social History   Socioeconomic History  . Marital status: Married    Spouse name: Carl Roberts  . Number of children: 2  . Years of education: high school, some college  . Highest education level: Not on file  Occupational History  . Not on file  Social Needs  . Financial resource strain: Not hard at all  . Food insecurity    Worry: Not on file    Inability: Not on file  . Transportation needs    Medical: Not on file    Non-medical: Not on file  Tobacco Use  . Smoking status: Former Smoker    Packs/day: 0.50    Years: 4.00    Pack years: 2.00    Types: Cigarettes, Pipe    Quit date: 08/03/1998    Years since quitting: 20.4  . Smokeless tobacco: Never Used  Substance and Sexual Activity  . Alcohol use: Yes    Alcohol/week: 14.0 standard drinks    Types: 14 Shots of liquor per week    Comment: 2 drinks a day-wine or whiskey  . Drug use: No  . Sexual activity: Yes    Birth control/protection: None  Lifestyle  . Physical activity    Days per week: Not on file    Minutes per session: Not on file  . Stress:  Not on file  Relationships  . Social Herbalist on phone: Not on file    Gets together: Not on file    Attends religious service: Not on file    Active member of club or organization: Not on file    Attends meetings of clubs or organizations: Not on file    Relationship status: Not on file  . Intimate partner violence    Fear of current or ex partner: Not on file    Emotionally abused: Not on file    Physically abused: Not on file    Forced sexual activity: Not on file  Other Topics Concern  . Not on file  Social History Narrative   Lives with wife, Carl Roberts   2 children   Exercise: tries to get 3 days a week, was running 2 times a week   Review of Systems No tobacco products Appetite is okay No N/V No abdominal pain    Objective:   Physical Exam  Constitutional: He appears well-developed.  No distress.  Respiratory: Effort normal. No respiratory distress.  Psychiatric: He has a normal mood and affect. His behavior is normal.           Assessment & Plan:

## 2019-01-26 ENCOUNTER — Telehealth: Payer: Self-pay | Admitting: *Deleted

## 2019-01-26 NOTE — Telephone Encounter (Signed)
Spoke with patient.  He states he is being tested for COVID 19 today at [facility] through his workplace.

## 2019-01-26 NOTE — Telephone Encounter (Signed)
-----   Message from Venia Carbon, MD sent at 01/25/2019 11:42 AM EDT ----- Please set up testing for this patient that has had a few days of symptoms highly suggestive of COVID  Thanks!

## 2019-02-02 DIAGNOSIS — Z713 Dietary counseling and surveillance: Secondary | ICD-10-CM | POA: Diagnosis not present

## 2019-02-06 ENCOUNTER — Encounter: Payer: Self-pay | Admitting: Family Medicine

## 2019-02-06 ENCOUNTER — Other Ambulatory Visit: Payer: Self-pay

## 2019-02-06 ENCOUNTER — Other Ambulatory Visit (INDEPENDENT_AMBULATORY_CARE_PROVIDER_SITE_OTHER): Payer: BC Managed Care – PPO

## 2019-02-06 DIAGNOSIS — E291 Testicular hypofunction: Secondary | ICD-10-CM | POA: Diagnosis not present

## 2019-02-06 DIAGNOSIS — R030 Elevated blood-pressure reading, without diagnosis of hypertension: Secondary | ICD-10-CM

## 2019-02-06 LAB — COMPREHENSIVE METABOLIC PANEL
ALT: 23 U/L (ref 0–53)
AST: 12 U/L (ref 0–37)
Albumin: 4.6 g/dL (ref 3.5–5.2)
Alkaline Phosphatase: 62 U/L (ref 39–117)
BUN: 16 mg/dL (ref 6–23)
CO2: 28 mEq/L (ref 19–32)
Calcium: 9.4 mg/dL (ref 8.4–10.5)
Chloride: 105 mEq/L (ref 96–112)
Creatinine, Ser: 0.9 mg/dL (ref 0.40–1.50)
GFR: 92.87 mL/min (ref >60.00)
Glucose, Bld: 97 mg/dL (ref 70–99)
Potassium: 4.4 mEq/L (ref 3.5–5.1)
Sodium: 141 mEq/L (ref 135–145)
Total Bilirubin: 0.5 mg/dL (ref 0.2–1.2)
Total Protein: 6.9 g/dL (ref 6.0–8.3)

## 2019-02-06 LAB — TESTOSTERONE: Testosterone: 492.74 ng/dL (ref 300.00–890.00)

## 2019-02-06 LAB — CBC
HCT: 43.6 % (ref 39.0–52.0)
Hemoglobin: 14.8 g/dL (ref 13.0–17.0)
MCHC: 33.9 g/dL (ref 30.0–36.0)
MCV: 92.7 fl (ref 78.0–100.0)
Platelets: 238 10*3/uL (ref 150.0–400.0)
RBC: 4.71 Mil/uL (ref 4.22–5.81)
RDW: 13.3 % (ref 11.5–15.5)
WBC: 5.7 10*3/uL (ref 4.0–10.5)

## 2019-02-09 DIAGNOSIS — Z713 Dietary counseling and surveillance: Secondary | ICD-10-CM | POA: Diagnosis not present

## 2019-02-16 DIAGNOSIS — Z713 Dietary counseling and surveillance: Secondary | ICD-10-CM | POA: Diagnosis not present

## 2019-02-27 ENCOUNTER — Other Ambulatory Visit: Payer: Self-pay

## 2019-02-27 ENCOUNTER — Ambulatory Visit: Payer: BC Managed Care – PPO | Admitting: Family Medicine

## 2019-02-27 ENCOUNTER — Encounter: Payer: Self-pay | Admitting: Family Medicine

## 2019-02-27 VITALS — BP 122/80 | HR 70 | Temp 98.0°F | Ht 72.25 in | Wt 287.1 lb

## 2019-02-27 DIAGNOSIS — R21 Rash and other nonspecific skin eruption: Secondary | ICD-10-CM | POA: Diagnosis not present

## 2019-02-27 NOTE — Patient Instructions (Signed)
Good to see you today, continue steroid cream twice a day, avoid hot water

## 2019-02-27 NOTE — Progress Notes (Signed)
Subjective:    Patient ID: Carl Roberts, male    DOB: 05/12/1976, 43 y.o.   MRN: [id number]  HPI Chief Complaint  Patient presents with  . Rash    Pt c/o bilateral hand rash - red, dry skin, cracking and bleeding. Intense itching. Using Kenalog creama and Aquaphor   This is a 43 yo male who presents today with above symptoms. He was seen 12/15/18- prescribed triamcinolone. He has used this off and on twice a day since May. Leads to cracking. Very itchy. Also applying aquaphor. Terribly itchy, worse with hot water. Has spread up hands and onto wrists. Thought it may be related to his cycling gloves so he washed them without improvement. Uses same soap at home and work. No new products. Wife without rash.    Past Medical History:  Diagnosis Date  . Asthma   . Hyperlipidemia   . Hypertension   . Low testosterone   . Stomach ulcer    Past Surgical History:  Procedure Laterality Date  . BACK SURGERY     Family History  Problem Relation Age of Onset  . Arthritis Mother   . Diabetes Mother   . Arthritis Father   . Glaucoma Father   . Asthma Maternal Grandmother   . Lung cancer Maternal Grandmother   . COPD Maternal Grandfather   . Heart disease Maternal Grandfather   . Hyperlipidemia Maternal Grandfather   . Heart attack Maternal Grandfather 55  . Alzheimer's disease Paternal Grandmother   . Parkinson's disease Paternal Grandfather   . Heart attack Paternal Grandfather 35  . Allergies Son   . Hypertension Brother   . Hyperlipidemia Brother    Social History   Tobacco Use  . Smoking status: Former Smoker    Packs/day: 0.50    Years: 4.00    Pack years: 2.00    Types: Cigarettes, Pipe    Quit date: 08/03/1998    Years since quitting: 20.5  . Smokeless tobacco: Never Used  Substance Use Topics  . Alcohol use: Yes    Alcohol/week: 14.0 standard drinks    Types: 14 Shots of liquor per week    Comment: 2 drinks a day-wine or whiskey  . Drug use: No      Review  of Systems Per HPI    Objective:   Physical Exam Vitals signs reviewed.  Constitutional:      General: He is not in acute distress.    Appearance: Normal appearance. He is obese. He is not ill-appearing, toxic-appearing or diaphoretic.  Eyes:     Conjunctiva/sclera: Conjunctivae normal.  Cardiovascular:     Rate and Rhythm: Normal rate.  Pulmonary:     Effort: Pulmonary effort is normal.  Skin:    General: Skin is warm and dry.     Findings: Rash present.     Comments: Bilateral dorsal hands and between fingers with scattered patchy erythema and flaking, thickening, few papules, extends past wrists.   Neurological:     Mental Status: He is alert and oriented to person, place, and time.  Psychiatric:        Mood and Affect: Mood normal.        Behavior: Behavior normal.        Thought Content: Thought content normal.        Judgment: Judgment normal.       BP 122/80 (BP Location: Left Arm, Patient Position: Sitting, Cuff Size: Normal)   Pulse 70   Temp 98 F (36.7  C) (Temporal)   Ht 6' 0.25" (1.835 m)   Wt 287 lb 1.9 oz (130.2 kg)   SpO2 96%   BMI 38.67 kg/m  Wt Readings from Last 3 Encounters:  02/27/19 287 lb 1.9 oz (130.2 kg)  01/25/19 274 lb (124.3 kg)  12/15/18 272 lb (123.4 kg)       Assessment & Plan:  1. Rash -Given worsening symptoms despite topical treatment, have asked dermatology to see him this week -He was instructed to continue triamcinolone cream twice a week, avoid use of hot water on hands and can take over-the-counter antihistamine if he feels that improves itching - Ambulatory referral to Dermatology-appointment obtained for Thursday, 03/02/2019 with [employer], PA.   Clarene Reamer, FNP-BC  [facility]  02/27/2019 10:02 AM

## 2019-03-02 DIAGNOSIS — L308 Other specified dermatitis: Secondary | ICD-10-CM | POA: Diagnosis not present

## 2019-03-02 DIAGNOSIS — Z713 Dietary counseling and surveillance: Secondary | ICD-10-CM | POA: Diagnosis not present

## 2019-03-09 DIAGNOSIS — Z713 Dietary counseling and surveillance: Secondary | ICD-10-CM | POA: Diagnosis not present

## 2019-03-16 DIAGNOSIS — Z713 Dietary counseling and surveillance: Secondary | ICD-10-CM | POA: Diagnosis not present

## 2019-03-30 DIAGNOSIS — Z713 Dietary counseling and surveillance: Secondary | ICD-10-CM | POA: Diagnosis not present

## 2019-04-06 DIAGNOSIS — Z713 Dietary counseling and surveillance: Secondary | ICD-10-CM | POA: Diagnosis not present

## 2019-04-28 DIAGNOSIS — Z713 Dietary counseling and surveillance: Secondary | ICD-10-CM | POA: Diagnosis not present

## 2019-05-05 DIAGNOSIS — Z713 Dietary counseling and surveillance: Secondary | ICD-10-CM | POA: Diagnosis not present

## 2019-06-08 DIAGNOSIS — Z713 Dietary counseling and surveillance: Secondary | ICD-10-CM | POA: Diagnosis not present

## 2019-06-23 DIAGNOSIS — Z713 Dietary counseling and surveillance: Secondary | ICD-10-CM | POA: Diagnosis not present

## 2019-07-04 DIAGNOSIS — Z713 Dietary counseling and surveillance: Secondary | ICD-10-CM | POA: Diagnosis not present

## 2019-07-28 ENCOUNTER — Other Ambulatory Visit: Payer: Self-pay | Admitting: Family Medicine

## 2019-07-28 DIAGNOSIS — E291 Testicular hypofunction: Secondary | ICD-10-CM

## 2019-07-28 NOTE — Telephone Encounter (Signed)
Last refilled on 08/03/2018 #10 ml with 1 refill  LOV 02/27/2019 rash

## 2019-08-28 DIAGNOSIS — G4733 Obstructive sleep apnea (adult) (pediatric): Secondary | ICD-10-CM | POA: Diagnosis not present

## 2019-09-18 DIAGNOSIS — S8392XA Sprain of unspecified site of left knee, initial encounter: Secondary | ICD-10-CM | POA: Diagnosis not present

## 2019-10-06 DIAGNOSIS — M25562 Pain in left knee: Secondary | ICD-10-CM | POA: Diagnosis not present

## 2019-10-31 DIAGNOSIS — M25562 Pain in left knee: Secondary | ICD-10-CM | POA: Diagnosis not present

## 2019-11-23 ENCOUNTER — Encounter: Payer: Self-pay | Admitting: Family Medicine

## 2019-11-23 ENCOUNTER — Other Ambulatory Visit: Payer: Self-pay

## 2019-11-23 ENCOUNTER — Ambulatory Visit: Payer: BC Managed Care – PPO | Admitting: Family Medicine

## 2019-11-23 VITALS — BP 122/78 | HR 88 | Temp 96.9°F | Ht 72.25 in | Wt 305.0 lb

## 2019-11-23 DIAGNOSIS — E291 Testicular hypofunction: Secondary | ICD-10-CM

## 2019-11-23 DIAGNOSIS — E782 Mixed hyperlipidemia: Secondary | ICD-10-CM

## 2019-11-23 DIAGNOSIS — Z0000 Encounter for general adult medical examination without abnormal findings: Secondary | ICD-10-CM

## 2019-11-23 DIAGNOSIS — Z114 Encounter for screening for human immunodeficiency virus [HIV]: Secondary | ICD-10-CM | POA: Diagnosis not present

## 2019-11-23 NOTE — Patient Instructions (Signed)
Preventive Care 41-42 Years Old, Male Preventive care refers to lifestyle choices and visits with your health care provider that can promote health and wellness. This includes:  A yearly physical exam. This is also called an annual well check.  Regular dental and eye exams.  Immunizations.  Screening for certain conditions.  Healthy lifestyle choices, such as eating a healthy diet, getting regular exercise, not using drugs or products that contain nicotine and tobacco, and limiting alcohol use. What can I expect for my preventive care visit? Physical exam Your health care provider will check:  Height and weight. These may be used to calculate body mass index (BMI), which is a measurement that tells if you are at a healthy weight.  Heart rate and blood pressure.  Your skin for abnormal spots. Counseling Your health care provider may ask you questions about:  Alcohol, tobacco, and drug use.  Emotional well-being.  Home and relationship well-being.  Sexual activity.  Eating habits.  Work and work Statistician. What immunizations do I need?  Influenza (flu) vaccine  This is recommended every year. Tetanus, diphtheria, and pertussis (Tdap) vaccine  You may need a Td booster every 10 years. Varicella (chickenpox) vaccine  You may need this vaccine if you have not already been vaccinated. Zoster (shingles) vaccine  You may need this after age 64. Measles, mumps, and rubella (MMR) vaccine  You may need at least one dose of MMR if you were born in 1957 or later. You may also need a second dose. Pneumococcal conjugate (PCV13) vaccine  You may need this if you have certain conditions and were not previously vaccinated. Pneumococcal polysaccharide (PPSV23) vaccine  You may need one or two doses if you smoke cigarettes or if you have certain conditions. Meningococcal conjugate (MenACWY) vaccine  You may need this if you have certain conditions. Hepatitis A  vaccine  You may need this if you have certain conditions or if you travel or work in places where you may be exposed to hepatitis A. Hepatitis B vaccine  You may need this if you have certain conditions or if you travel or work in places where you may be exposed to hepatitis B. Haemophilus influenzae type b (Hib) vaccine  You may need this if you have certain risk factors. Human papillomavirus (HPV) vaccine  If recommended by your health care provider, you may need three doses over 6 months. You may receive vaccines as individual doses or as more than one vaccine together in one shot (combination vaccines). Talk with your health care provider about the risks and benefits of combination vaccines. What tests do I need? Blood tests  Lipid and cholesterol levels. These may be checked every 5 years, or more frequently if you are over 60 years old.  Hepatitis C test.  Hepatitis B test. Screening  Lung cancer screening. You may have this screening every year starting at age 43 if you have a 30-pack-year history of smoking and currently smoke or have quit within the past 15 years.  Prostate cancer screening. Recommendations will vary depending on your family history and other risks.  Colorectal cancer screening. All adults should have this screening starting at age 72 and continuing until age 2. Your health care provider may recommend screening at age 14 if you are at increased risk. You will have tests every 1-10 years, depending on your results and the type of screening test.  Diabetes screening. This is done by checking your blood sugar (glucose) after you have not eaten  for a while (fasting). You may have this done every 1-3 years.  Sexually transmitted disease (STD) testing. Follow these instructions at home: Eating and drinking  Eat a diet that includes fresh fruits and vegetables, whole grains, lean protein, and low-fat dairy products.  Take vitamin and mineral supplements as  recommended by your health care provider.  Do not drink alcohol if your health care provider tells you not to drink.  If you drink alcohol: ? Limit how much you have to 0-2 drinks a day. ? Be aware of how much alcohol is in your drink. In the [place] one drink equals one 12 oz bottle of beer (355 mL), one 5 oz glass of wine (148 mL), or one 1 oz glass of hard liquor (44 mL). Lifestyle  Take daily care of your teeth and gums.  Stay active. Exercise for at least 30 minutes on 5 or more days each week.  Do not use any products that contain nicotine or tobacco, such as cigarettes, e-cigarettes, and chewing tobacco. If you need help quitting, ask your health care provider.  If you are sexually active, practice safe sex. Use a condom or other form of protection to prevent STIs (sexually transmitted infections).  Talk with your health care provider about taking a low-dose aspirin every day starting at age 53. What's next?  Go to your health care provider once a year for a well check visit.  Ask your health care provider how often you should have your eyes and teeth checked.  Stay up to date on all vaccines. This information is not intended to replace advice given to you by your health care provider. Make sure you discuss any questions you have with your health care provider. Document Revised: 06/30/2018 Document Reviewed: 06/30/2018 Elsevier Patient Education  2020 [employer].

## 2019-11-23 NOTE — Progress Notes (Signed)
Annual Exam   Chief Complaint:  Chief Complaint  Patient presents with  . Would like to get lab work done    History of Present Illness:  Carl Roberts is a 44 y.o. presents today for annual examination.    Tore meniscus cycling but is returning to cardio  #Hypogonadism - last injection was 2 weeks ago    Nutrition/Lifestyle Diet: too much alcohol - 2-3 per day which comes with snacking, reducing to 1 glass of wine Exercise: 3 days a week of cardio, some knee pain He is single partner, contraception - none. Currently trying  Social History   Tobacco Use  Smoking Status Former Smoker  . Packs/day: 0.50  . Years: 4.00  . Pack years: 2.00  . Types: Cigarettes, Pipe  . Quit date: 08/03/1998  . Years since quitting: 21.3  Smokeless Tobacco Never Used   Social History   Substance and Sexual Activity  Alcohol Use Yes   Comment: 1 glass of wine, liquor on the weekends   Social History   Substance and Sexual Activity  Drug Use No     Safety The patient wears seatbelts: yes.     The patient feels safe at home and in their relationships: yes.  General Health Dentist in the last year: No - planning to schedule Eye doctor: yes  Weight Wt Readings from Last 3 Encounters:  11/23/19 (!) 305 lb (138.3 kg)  02/27/19 287 lb 1.9 oz (130.2 kg)  01/25/19 274 lb (124.3 kg)   Patient has high BMI  BMI Readings from Last 1 Encounters:  11/23/19 41.08 kg/m   Does well during the day Needs to reduce snacking  Chronic disease screening Blood pressure monitoring:  BP Readings from Last 3 Encounters:  11/23/19 122/78  02/27/19 122/80  08/03/18 (!) 148/94    Lipid Monitoring: Indication for screening: age >35, obesity, diabetes, family hx, CV risk factors.  Lipid screening: Yes  No results found for: CHOL, HDL, LDLCALC, LDLDIRECT, TRIG, CHOLHDL   Diabetes Screening: age >22, overweight, family hx, PCOS, hx of gestational diabetes, at risk ethnicity, elevated  blood pressure >135/80.  Diabetes Screening screening: Yes  No results found for: HGBA1C   Immunization History  Administered Date(s) Administered  . Influenza,inj,Quad PF,6+ Mos 08/03/2018  . Pneumococcal Polysaccharide-23 12/25/2015  . Tdap 04/17/2011    Past Medical History:  Diagnosis Date  . Asthma   . Hyperlipidemia   . Hypertension   . Low testosterone   . Pituitary adenoma ([place]) 10/26/2011   Overview:  Last MRI normal 10/2011  . Stomach ulcer     Past Surgical History:  Procedure Laterality Date  . BACK SURGERY      Prior to Admission medications   Medication Sig Start Date End Date Taking? Authorizing Provider  testosterone cypionate (DEPOTESTOSTERONE CYPIONATE) 200 MG/ML injection INJECT 1 ML (200 MG TOTAL) INTO THE MUSCLE EVERY 14 (FOURTEEN) DAYS. 07/28/19  Yes Lesleigh Noe, MD  triamcinolone cream (KENALOG) 0.1 % APPLY TO AFFECTED AREA TWICE A DAY 01/02/19  Yes Lesleigh Noe, MD    Allergies  Allergen Reactions  . Naproxen Shortness Of Breath  . Shellfish Allergy Other (See Comments)     Social History   Socioeconomic History  . Marital status: Married    Spouse name: Carl Roberts  . Number of children: 2  . Years of education: high school, some college  . Highest education level: Not on file  Occupational History  . Not on file  Tobacco Use  .  Smoking status: Former Smoker    Packs/day: 0.50    Years: 4.00    Pack years: 2.00    Types: Cigarettes, Pipe    Quit date: 08/03/1998    Years since quitting: 21.3  . Smokeless tobacco: Never Used  Substance and Sexual Activity  . Alcohol use: Yes    Comment: 1 glass of wine, liquor on the weekends  . Drug use: No  . Sexual activity: Yes    Birth control/protection: None  Other Topics Concern  . Not on file  Social History Narrative   Lives with wife, Carl Roberts   2 children   Exercise: tries to get 3 days a week, was running 2 times a week   Social Determinants of Health   Financial Resource  Strain:   . Difficulty of Paying Living Expenses:   Food Insecurity:   . Worried About Charity fundraiser in the Last Year:   . Arboriculturist in the Last Year:   Transportation Needs:   . Film/video editor (Medical):   Marland Kitchen Lack of Transportation (Non-Medical):   Physical Activity:   . Days of Exercise per Week:   . Minutes of Exercise per Session:   Stress:   . Feeling of Stress :   Social Connections:   . Frequency of Communication with Friends and Family:   . Frequency of Social Gatherings with Friends and Family:   . Attends Religious Services:   . Active Member of Clubs or Organizations:   . Attends Archivist Meetings:   Marland Kitchen Marital Status:   Intimate Partner Violence:   . Fear of Current or Ex-Partner:   . Emotionally Abused:   Marland Kitchen Physically Abused:   . Sexually Abused:     Family History  Problem Relation Age of Onset  . Arthritis Mother   . Diabetes Mother   . Arthritis Father   . Glaucoma Father   . Asthma Maternal Grandmother   . Lung cancer Maternal Grandmother   . COPD Maternal Grandfather   . Heart disease Maternal Grandfather   . Hyperlipidemia Maternal Grandfather   . Heart attack Maternal Grandfather 55  . Alzheimer's disease Paternal Grandmother   . Parkinson's disease Paternal Grandfather   . Heart attack Paternal Grandfather 24  . Allergies Son   . Hypertension Brother   . Hyperlipidemia Brother     Review of Systems  Constitutional: Negative for chills and fever.  HENT: Negative for congestion and sore throat.   Eyes: Negative for blurred vision and double vision.  Respiratory: Negative for shortness of breath.   Cardiovascular: Negative for chest pain.  Gastrointestinal: Negative for heartburn, nausea and vomiting.  Genitourinary: Negative.   Musculoskeletal: Negative.  Negative for myalgias.  Skin: Negative for rash.  Neurological: Negative for dizziness and headaches.  Endo/Heme/Allergies: Does not bruise/bleed easily.   Psychiatric/Behavioral: Negative for depression. The patient is not nervous/anxious.      Physical Exam BP 122/78   Pulse 88   Temp (!) 96.9 F (36.1 C)   Ht 6' 0.25" (1.835 m)   Wt (!) 305 lb (138.3 kg)   BMI 41.08 kg/m    BP Readings from Last 3 Encounters:  11/23/19 122/78  02/27/19 122/80  08/03/18 (!) 148/94      Physical Exam Constitutional:      Appearance: Normal appearance. He is not ill-appearing or diaphoretic.  HENT:     Head: Normocephalic and atraumatic.     Right Ear: Tympanic membrane and  external ear normal.     Left Ear: Tympanic membrane and external ear normal.     Nose: Nose normal.     Mouth/Throat:     Mouth: Mucous membranes are moist.  Eyes:     General: No scleral icterus.    Extraocular Movements: Extraocular movements intact.     Conjunctiva/sclera: Conjunctivae normal.  Cardiovascular:     Rate and Rhythm: Normal rate and regular rhythm.     Heart sounds: No murmur.  Pulmonary:     Effort: Pulmonary effort is normal. No respiratory distress.     Breath sounds: Normal breath sounds. No wheezing.  Abdominal:     General: Abdomen is flat. Bowel sounds are normal. There is no distension.     Palpations: Abdomen is soft.     Tenderness: There is no abdominal tenderness. There is no guarding.  Musculoskeletal:        General: Normal range of motion.     Cervical back: Neck supple.  Lymphadenopathy:     Cervical: No cervical adenopathy.  Skin:    General: Skin is warm and dry.  Neurological:     Mental Status: He is alert. Mental status is at baseline.     Motor: No weakness.  Psychiatric:        Mood and Affect: Mood normal.        Thought Content: Thought content normal.        Results:  PHQ-9:    Office Visit from 08/03/2018 in [place] at [facility]  PHQ-9 Total Score  0        Assessment: 44 y.o. here for routine annual physical examination.  Plan: Problem List Items Addressed This Visit       Endocrine   Hypogonadism in male - Primary   Relevant Orders   Testosterone   CBC     Other   Hyperlipidemia, mixed   Relevant Orders   Lipid panel    Other Visit Diagnoses    Annual physical exam       Relevant Orders   Lipid panel   Hemoglobin A1c   Comprehensive metabolic panel   Screening for HIV (human immunodeficiency virus)       Relevant Orders   HIV Antibody (routine testing w rflx)   Morbid obesity ([place])       Relevant Orders   Lipid panel   Hemoglobin A1c   Comprehensive metabolic panel      Screening: -- Blood pressure screen elevated: continued to monitor. -- cholesterol screening: will obtain -- Weight screening: obese: discussed management options, including lifestyle, dietary, and exercise. -- Diabetes Screening: will obtain -- Nutrition: normal - encouraged healthy diet and less snacking  The 10-year ASCVD risk score [name], et al., 2013) is: 1.5%   Values used to calculate the score:     Age: 20 years     Sex: Male     Is Non-Hispanic African American: No     Diabetic: No     Tobacco smoker: No     Systolic Blood Pressure: 123XX123 mmHg     Is BP treated: No     HDL Cholesterol: 41 mg/dL     Total Cholesterol: 187 mg/dL  -- Statin therapy for Age 60-75 with CVD risk >7.5%  Psych -- Depression screening (PHQ-9):    Office Visit from 08/03/2018 in [place] at [facility]  PHQ-9 Total Score  0       Safety -- tobacco screening: not  using -- alcohol screening: Pt cutting back to lower use -- no evidence of domestic violence or intimate partner violence.   Cancer Screening -- No age related cancer screening due  Immunizations -- flu vaccine up to date -- TDAP q10 years up to date  Discussed getting covid vaccine - he will do so.   Encouraged regular dental, vision care and healthy eating and exercise.     Lesleigh Noe

## 2019-11-30 ENCOUNTER — Other Ambulatory Visit: Payer: BC Managed Care – PPO

## 2019-11-30 ENCOUNTER — Telehealth: Payer: Self-pay

## 2019-11-30 ENCOUNTER — Other Ambulatory Visit: Payer: Self-pay

## 2019-11-30 ENCOUNTER — Ambulatory Visit: Payer: BC Managed Care – PPO | Admitting: Family Medicine

## 2019-11-30 ENCOUNTER — Encounter: Payer: Self-pay | Admitting: Family Medicine

## 2019-11-30 VITALS — BP 142/86 | HR 115 | Temp 97.0°F | Resp 22 | Ht 72.25 in | Wt 311.2 lb

## 2019-11-30 DIAGNOSIS — E291 Testicular hypofunction: Secondary | ICD-10-CM | POA: Diagnosis not present

## 2019-11-30 DIAGNOSIS — R1031 Right lower quadrant pain: Secondary | ICD-10-CM

## 2019-11-30 MED ORDER — "BD HYPODERMIC NEEDLE 25G X 1-1/2"" MISC": 3 refills | Status: AC

## 2019-11-30 NOTE — Telephone Encounter (Signed)
See office visit from today 

## 2019-11-30 NOTE — Telephone Encounter (Signed)
Pt already has in office appt with Dr Einar Pheasant 11/30/19 at 3:20 PM. Loma Sousa said pt was in [place] when called for appt and pt did not want to go to ED;I tried to call pt to get more info about where pain was and pain level but no answer on phone.FYI to Dr Einar Pheasant and Azalee Course CMA.

## 2019-11-30 NOTE — Progress Notes (Signed)
Subjective:     Carl Roberts is a 44 y.o. male presenting for Abdominal Pain (RLQ, started yesterday 11/29/19. Not feeling well. No urinary concerns. No vomiting.)     Abdominal Pain This is a new problem. The current episode started yesterday. The onset quality is gradual. Episode frequency: pain present with moving, breathing, touching. The pain is located in the RLQ. The pain is at a severity of 6/10. The quality of the pain is sharp. The abdominal pain does not radiate. Associated symptoms include diarrhea and myalgias. Pertinent negatives include no anorexia, arthralgias, constipation, dysuria, fever, frequency, nausea or vomiting. The pain is aggravated by certain positions, movement, palpation, deep breathing and eating. The pain is relieved by nothing. He has tried nothing for the symptoms. There is no history of abdominal surgery.      Review of Systems  Constitutional: Negative for fever.  Respiratory: Negative for cough and shortness of breath.   Gastrointestinal: Positive for abdominal pain and diarrhea. Negative for anorexia, constipation, nausea and vomiting.  Genitourinary: Negative for dysuria and frequency.  Musculoskeletal: Positive for myalgias. Negative for arthralgias.     Social History   Tobacco Use  Smoking Status Former Smoker  . Packs/day: 0.50  . Years: 4.00  . Pack years: 2.00  . Types: Cigarettes, Pipe  . Quit date: 08/03/1998  . Years since quitting: 21.3  Smokeless Tobacco Never Used        Objective:    BP Readings from Last 3 Encounters:  11/30/19 (!) 142/86  11/23/19 122/78  02/27/19 122/80   Wt Readings from Last 3 Encounters:  11/30/19 (!) 311 lb 4 oz (141.2 kg)  11/23/19 (!) 305 lb (138.3 kg)  02/27/19 287 lb 1.9 oz (130.2 kg)    BP (!) 142/86   Pulse (!) 115   Temp (!) 97 F (36.1 C)   Resp (!) 22   Ht 6' 0.25" (1.835 m)   Wt (!) 311 lb 4 oz (141.2 kg)   SpO2 97%   BMI 41.92 kg/m    Physical  Exam Constitutional:      Appearance: Normal appearance. He is well-developed. He is not ill-appearing or diaphoretic.  HENT:     Head: Normocephalic and atraumatic.     Right Ear: External ear normal.     Left Ear: External ear normal.     Nose: Nose normal.  Eyes:     General: No scleral icterus.    Extraocular Movements: Extraocular movements intact.     Conjunctiva/sclera: Conjunctivae normal.  Cardiovascular:     Rate and Rhythm: Regular rhythm. Tachycardia present.     Heart sounds: No murmur.  Pulmonary:     Effort: Pulmonary effort is normal. No respiratory distress.     Breath sounds: Normal breath sounds. No wheezing.  Abdominal:     General: Abdomen is flat. Bowel sounds are decreased. There is no distension.     Palpations: Abdomen is soft.     Tenderness: There is no abdominal tenderness. There is no guarding or rebound. Negative signs include Murphy's sign, McBurney's sign and psoas sign.  Musculoskeletal:     Cervical back: Neck supple.  Skin:    General: Skin is warm and dry.  Neurological:     Mental Status: He is alert. Mental status is at baseline.  Psychiatric:        Mood and Affect: Mood normal.           Assessment & Plan:   Problem List  Items Addressed This Visit      Endocrine   Hypogonadism in male   Relevant Medications   NEEDLE, DISP, 25 G (BD HYPODERMIC NEEDLE) 25G X 1-1/2" MISC    Other Visit Diagnoses    Right lower quadrant abdominal pain    -  Primary   Relevant Orders   CBC with Differential   Comprehensive metabolic panel     RLQ abdominal pain - overall benign abdominal exam w/o any worsening pain with palpation or concerning features. Given tachycardia and tachypnea will get labs and strict ER precautions for new symptoms or worsening pain or fever.   Pending blood work will get CT Abd/pelvis if pain not improved tomorrow.   Return if symptoms worsen or fail to improve.  Lesleigh Noe, MD

## 2019-11-30 NOTE — Telephone Encounter (Signed)
[facility] - Client TELEPHONE ADVICE RECORD [facility] Patient Name: Carl Roberts Gender: Male DOB: 1976/05/09 Age: 44 Y 1 M 2 D Return Phone Number: [phone] (Primary), [phone] (Secondary) Address: [place] [address] Client [facility] - Client Client Site [place] - Day Contact Type Call Who Is Calling Patient / Member / Family / Caregiver Call Type Triage / Clinical Relationship To Patient Self Return Phone Number [phone] (Primary) Chief Complaint Hip pain Reason for Call Symptomatic / Request for Health Information Initial Comment caller has sharp right side dull pain, caller thinks it may be his appendix Translation No Nurse Assessment Nurse: Harlow Mares, RN, Suanne Marker Date/Time Eilene Ghazi Time): 11/30/2019 12:25:49 PM Confirm and document reason for call. If symptomatic, describe symptoms. ---caller has sharp right side dull pain, caller thinks it may be his appendix. Pain began yesterday afternoon, worse with a deep breath, sore to the touch. Has the patient had close contact with a person known or suspected to have the novel coronavirus illness OR traveled / lives in area with major community spread (including international travel) in the last 14 days from the onset of symptoms? * If Asymptomatic, screen for exposure and travel within the last 14 days. ---No Does the patient have any new or worsening symptoms? ---Yes Will a triage be completed? ---Yes Related visit to physician within the last 2 weeks? ---No Does the PT have any chronic conditions? (i.e. diabetes, asthma, this includes High risk factors for pregnancy, etc.) ---Yes List chronic conditions. ---low testosterone; Is this a behavioral health or substance abuse call? ---No Guidelines Guideline Title Affirmed Question Affirmed Notes Nurse Date/Time (Eastern Time) Abdominal Pain - Male [1]  MILD-MODERATE pain AND [2] constant AND [3] present > 2 hours Harlow Mares, RN, Suanne Marker 11/30/2019 12:28:43 PM Disp. Time Eilene Ghazi Time) Disposition Final User PLEASE NOTE: All timestamps contained within this report are represented as [place] Standard Time. CONFIDENTIALTY NOTICE: This fax transmission is intended only for the addressee. It contains information that is legally privileged, confidential or otherwise protected from use or disclosure. If you are not the intended recipient, you are strictly prohibited from reviewing, disclosing, copying using or disseminating any of this information or taking any action in reliance on or regarding this information. If you have received this fax in error, please notify us immediately by telephone so that we can arrange for its return to [place]. Phone: [phone], Toll-Free: [phone], Fax: [phone] Page: 2 of 2 Call Id: [id number] 11/30/2019 12:38:23 PM See HCP within 4 Hours (or PCP triage) Yes Harlow Mares, RN, Rosalyn Charters Disagree/Comply Comply Caller Understands Yes PreDisposition Did not know what to do Care Advice Given Per Guideline SEE HCP WITHIN 4 HOURS (OR PCP TRIAGE): * IF OFFICE WILL BE OPEN: You need to be seen within the next 3 or 4 hours. Call your doctor (or NP/PA) now or as soon as the office opens. NOTHING BY MOUTH: * Do not eat or drink anything for now. REST: * Lie down. * Rest until seen. CALL BACK IF: * You become worse. CARE ADVICE given per Abdominal Pain, Male (Adult) guideline. Comments User: Tamala Fothergill, RN Date/Time Eilene Ghazi Time): 11/30/2019 12:38:58 PM Nurse unable to reach office staff due to "phone/technical difficulties" according to a phone recording. Referrals GO TO FACILITY UNDECIDED

## 2019-11-30 NOTE — Patient Instructions (Addendum)
#  Abdominal pain - Blood work today - try ibuprofen or tylenol for pain - depending on results may get CT scan    Go to the ER - Fever - worsening pain - lightheaded or dizzy - nausea/vomiting - inability to eat or drink - distended abdomen - new concerning symptoms

## 2019-12-01 ENCOUNTER — Other Ambulatory Visit: Payer: Self-pay | Admitting: Family Medicine

## 2019-12-01 ENCOUNTER — Ambulatory Visit
Admission: RE | Admit: 2019-12-01 | Discharge: 2019-12-01 | Disposition: A | Discharge status: Home / Self Care | Payer: BC Managed Care – PPO | Source: Ambulatory Visit | Attending: Family Medicine | Admitting: Family Medicine

## 2019-12-01 ENCOUNTER — Telehealth: Payer: Self-pay | Admitting: Radiology

## 2019-12-01 ENCOUNTER — Telehealth: Payer: Self-pay | Admitting: Family Medicine

## 2019-12-01 DIAGNOSIS — R1031 Right lower quadrant pain: Secondary | ICD-10-CM

## 2019-12-01 DIAGNOSIS — D72829 Elevated white blood cell count, unspecified: Secondary | ICD-10-CM

## 2019-12-01 DIAGNOSIS — R109 Unspecified abdominal pain: Secondary | ICD-10-CM | POA: Diagnosis not present

## 2019-12-01 LAB — CBC WITH DIFFERENTIAL/PLATELET
Basophils Absolute: 0 10*3/uL (ref 0.0–0.1)
Basophils Relative: 0.1 % (ref 0.0–3.0)
Eosinophils Absolute: 0 10*3/uL (ref 0.0–0.7)
Eosinophils Relative: 0.3 % (ref 0.0–5.0)
HCT: 42.9 % (ref 39.0–52.0)
Hemoglobin: 14.8 g/dL (ref 13.0–17.0)
Lymphocytes Relative: 8.4 % — ABNORMAL LOW (ref 12.0–46.0)
Lymphs Abs: 1.5 10*3/uL (ref 0.7–4.0)
MCHC: 34.6 g/dL (ref 30.0–36.0)
MCV: 91.1 fl (ref 78.0–100.0)
Monocytes Absolute: 1 10*3/uL (ref 0.1–1.0)
Monocytes Relative: 5.7 % (ref 3.0–12.0)
Neutro Abs: 15.5 10*3/uL — ABNORMAL HIGH (ref 1.4–7.7)
Neutrophils Relative %: 85.5 % — ABNORMAL HIGH (ref 43.0–77.0)
Platelets: 235 10*3/uL (ref 150.0–400.0)
RBC: 4.71 Mil/uL (ref 4.22–5.81)
RDW: 13.1 % (ref 11.5–15.5)
WBC: 18.2 10*3/uL (ref 4.0–10.5)

## 2019-12-01 LAB — COMPREHENSIVE METABOLIC PANEL
ALT: 36 U/L (ref 0–53)
AST: 11 U/L (ref 0–37)
Albumin: 4.5 g/dL (ref 3.5–5.2)
Alkaline Phosphatase: 78 U/L (ref 39–117)
BUN: 14 mg/dL (ref 6–23)
CO2: 27 mEq/L (ref 19–32)
Calcium: 9.3 mg/dL (ref 8.4–10.5)
Chloride: 101 mEq/L (ref 96–112)
Creatinine, Ser: 0.96 mg/dL (ref 0.40–1.50)
GFR: 85.86 mL/min (ref >60.00)
Glucose, Bld: 98 mg/dL (ref 70–99)
Potassium: 3.9 mEq/L (ref 3.5–5.1)
Sodium: 137 mEq/L (ref 135–145)
Total Bilirubin: 0.6 mg/dL (ref 0.2–1.2)
Total Protein: 7.5 g/dL (ref 6.0–8.3)

## 2019-12-01 MED ORDER — CEPHALEXIN 500 MG PO CAPS: 500.0000 mg | ORAL_CAPSULE | Freq: Three times a day (TID) | ORAL | 0 refills | Status: DC

## 2019-12-01 MED ORDER — IOHEXOL 300 MG/ML  SOLN
100.0000 mL | Freq: Once | INTRAMUSCULAR | Status: AC | PRN
  Administered 2019-12-01: 100 mL via INTRAVENOUS

## 2019-12-01 NOTE — Telephone Encounter (Signed)
Reviewed yesterdays OV note from Dr. Einar Pheasant. Given elevated wbc in setting of  Right lower quadrant pain... CT abd pelvis to rule out appendicitis indicated.  Please call pt ASAP and I will order CT to be done STAT... please let me know preferred location.  Please let pt know ER precautions: increased  Or severe pain, fever unable to keep down liquids.

## 2019-12-01 NOTE — Telephone Encounter (Signed)
Carl Roberts notified as instructed by telephone. ER precautions given.  Patient states understanding.

## 2019-12-01 NOTE — Telephone Encounter (Signed)
Opened in error

## 2019-12-01 NOTE — Telephone Encounter (Signed)
PT NOTIFIED OF NEED.Marland Kitchen NO PREFERENCE FOR LOCATION.Marland Kitchen FIRST AVAILABLE. He is at [facility] awaiting call to schedule.

## 2019-12-01 NOTE — Telephone Encounter (Signed)
[facility] called a critical WBC - 18.2. Results given to [facility]

## 2019-12-01 NOTE — Telephone Encounter (Signed)
Agree with plan for stat CT and ER precautions. Appreciate support from Dr. Diona Browner.

## 2019-12-01 NOTE — Telephone Encounter (Signed)
See result note.  

## 2019-12-01 NOTE — Telephone Encounter (Signed)
Pt called to see who will be reviewing the note from his CT scan. He states they already came through on his mychart. He just wants to be advised on what to do over the weekend to monitor.

## 2019-12-01 NOTE — Telephone Encounter (Signed)
Error

## 2019-12-04 ENCOUNTER — Other Ambulatory Visit (INDEPENDENT_AMBULATORY_CARE_PROVIDER_SITE_OTHER): Payer: BC Managed Care – PPO

## 2019-12-04 ENCOUNTER — Other Ambulatory Visit: Payer: Self-pay

## 2019-12-04 DIAGNOSIS — Z114 Encounter for screening for human immunodeficiency virus [HIV]: Secondary | ICD-10-CM | POA: Diagnosis not present

## 2019-12-04 DIAGNOSIS — E291 Testicular hypofunction: Secondary | ICD-10-CM | POA: Diagnosis not present

## 2019-12-04 DIAGNOSIS — Z0000 Encounter for general adult medical examination without abnormal findings: Secondary | ICD-10-CM

## 2019-12-04 DIAGNOSIS — D72829 Elevated white blood cell count, unspecified: Secondary | ICD-10-CM | POA: Diagnosis not present

## 2019-12-04 DIAGNOSIS — E782 Mixed hyperlipidemia: Secondary | ICD-10-CM | POA: Diagnosis not present

## 2019-12-04 LAB — CBC WITH DIFFERENTIAL/PLATELET
Basophils Absolute: 0.1 10*3/uL (ref 0.0–0.1)
Basophils Relative: 1 % (ref 0.0–3.0)
Eosinophils Absolute: 0.3 10*3/uL (ref 0.0–0.7)
Eosinophils Relative: 4.4 % (ref 0.0–5.0)
HCT: 40.4 % (ref 39.0–52.0)
Hemoglobin: 14 g/dL (ref 13.0–17.0)
Lymphocytes Relative: 24.7 % (ref 12.0–46.0)
Lymphs Abs: 1.5 10*3/uL (ref 0.7–4.0)
MCHC: 34.7 g/dL (ref 30.0–36.0)
MCV: 91.7 fl (ref 78.0–100.0)
Monocytes Absolute: 0.4 10*3/uL (ref 0.1–1.0)
Monocytes Relative: 7.1 % (ref 3.0–12.0)
Neutro Abs: 3.7 10*3/uL (ref 1.4–7.7)
Neutrophils Relative %: 62.8 % (ref 43.0–77.0)
Platelets: 219 10*3/uL (ref 150.0–400.0)
RBC: 4.4 Mil/uL (ref 4.22–5.81)
RDW: 13.4 % (ref 11.5–15.5)
WBC: 5.9 10*3/uL (ref 4.0–10.5)

## 2019-12-04 LAB — LIPID PANEL
Cholesterol: 247 mg/dL — ABNORMAL HIGH (ref 0–200)
HDL: 38.8 mg/dL — ABNORMAL LOW (ref >39.00)
LDL Cholesterol: 174 mg/dL — ABNORMAL HIGH (ref 0–99)
NonHDL: 208.47
Total CHOL/HDL Ratio: 6
Triglycerides: 171 mg/dL — ABNORMAL HIGH (ref 0.0–149.0)
VLDL: 34.2 mg/dL (ref 0.0–40.0)

## 2019-12-04 LAB — COMPREHENSIVE METABOLIC PANEL
ALT: 25 U/L (ref 0–53)
AST: 10 U/L (ref 0–37)
Albumin: 4.2 g/dL (ref 3.5–5.2)
Alkaline Phosphatase: 66 U/L (ref 39–117)
BUN: 14 mg/dL (ref 6–23)
CO2: 29 mEq/L (ref 19–32)
Calcium: 9.2 mg/dL (ref 8.4–10.5)
Chloride: 103 mEq/L (ref 96–112)
Creatinine, Ser: 0.87 mg/dL (ref 0.40–1.50)
GFR: 96.18 mL/min (ref >60.00)
Glucose, Bld: 93 mg/dL (ref 70–99)
Potassium: 4.2 mEq/L (ref 3.5–5.1)
Sodium: 138 mEq/L (ref 135–145)
Total Bilirubin: 0.6 mg/dL (ref 0.2–1.2)
Total Protein: 6.7 g/dL (ref 6.0–8.3)

## 2019-12-04 LAB — HEMOGLOBIN A1C: Hgb A1c MFr Bld: 5 % (ref 4.6–6.5)

## 2019-12-05 LAB — TESTOSTERONE: Testosterone: 511.49 ng/dL (ref 300.00–890.00)

## 2019-12-05 LAB — HIV ANTIBODY (ROUTINE TESTING W REFLEX): HIV 1&2 Ab, 4th Generation: NONREACTIVE

## 2019-12-08 ENCOUNTER — Encounter: Payer: Self-pay | Admitting: Family Medicine

## 2019-12-11 ENCOUNTER — Encounter: Payer: Self-pay | Admitting: Family Medicine

## 2019-12-11 NOTE — Telephone Encounter (Signed)
Chart has been abstracted.

## 2019-12-21 ENCOUNTER — Encounter: Payer: Self-pay | Admitting: Family Medicine

## 2020-01-15 ENCOUNTER — Other Ambulatory Visit: Payer: Self-pay

## 2020-01-15 ENCOUNTER — Encounter: Payer: Self-pay | Admitting: Family Medicine

## 2020-01-15 ENCOUNTER — Ambulatory Visit: Payer: BC Managed Care – PPO | Admitting: Family Medicine

## 2020-01-15 VITALS — BP 138/90 | HR 65 | Temp 97.4°F | Ht 72.25 in | Wt 304.8 lb

## 2020-01-15 DIAGNOSIS — L249 Irritant contact dermatitis, unspecified cause: Secondary | ICD-10-CM | POA: Diagnosis not present

## 2020-01-15 DIAGNOSIS — L2481 Irritant contact dermatitis due to metals: Secondary | ICD-10-CM | POA: Diagnosis not present

## 2020-01-15 DIAGNOSIS — E291 Testicular hypofunction: Secondary | ICD-10-CM

## 2020-01-15 MED ORDER — TESTOSTERONE CYPIONATE 200 MG/ML IM SOLN: 200.0000 mg | INTRAMUSCULAR | 0 refills | Status: DC

## 2020-01-15 MED ORDER — TRIAMCINOLONE ACETONIDE 0.1 % EX CREA: TOPICAL_CREAM | CUTANEOUS | 0 refills | Status: DC

## 2020-01-15 NOTE — Progress Notes (Signed)
Subjective:     Carl Roberts is a 44 y.o. male presenting for Rash (left leg x 6 weeks )     HPI  #Rash - left leg - happened after wearing knee brace - would rub there - itchiness comes and goes - treatment: triamcinolone prn w/ some improvement - stayed in the same place - some tenderness from scratching - no fever/chills, breathing difficulty  #spot on side - 2 weeks - itching - just a rash  #testosterone refill - doing well - needs refill - labs recently normal - taking every 2 weeks  Review of Systems   Social History   Tobacco Use  Smoking Status Former Smoker  . Packs/day: 0.50  . Years: 4.00  . Pack years: 2.00  . Types: Cigarettes, Pipe  . Quit date: 08/03/1998  . Years since quitting: 21.4  Smokeless Tobacco Never Used        Objective:    BP Readings from Last 3 Encounters:  01/15/20 138/90  11/30/19 (!) 142/86  11/23/19 122/78   Wt Readings from Last 3 Encounters:  01/15/20 (!) 304 lb 12 oz (138.2 kg)  11/30/19 (!) 311 lb 4 oz (141.2 kg)  11/23/19 (!) 305 lb (138.3 kg)    BP 138/90   Pulse 65   Temp (!) 97.4 F (36.3 C) (Temporal)   Ht 6' 0.25" (1.835 m)   Wt (!) 304 lb 12 oz (138.2 kg)   SpO2 97%   BMI 41.05 kg/m    Physical Exam Constitutional:      Appearance: Normal appearance. He is not ill-appearing or diaphoretic.  HENT:     Right Ear: External ear normal.     Left Ear: External ear normal.     Nose: Nose normal.  Eyes:     General: No scleral icterus.    Extraocular Movements: Extraocular movements intact.     Conjunctiva/sclera: Conjunctivae normal.  Cardiovascular:     Rate and Rhythm: Normal rate.  Pulmonary:     Effort: Pulmonary effort is normal.  Musculoskeletal:     Cervical back: Neck supple.  Skin:    General: Skin is warm and dry.     Comments: Right side - erythematous macular rash  Left lower leg - erythematous ulcers and papular rash  Neurological:     Mental Status: He is alert.  Mental status is at baseline.  Psychiatric:        Mood and Affect: Mood normal.           Assessment & Plan:   Problem List Items Addressed This Visit      Endocrine   Hypogonadism in male    Reviewed recent testosterone in range, and normal HCT. Refill testosterone. Return 6 months.       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 Lesion on leg with some improvement with occasional triamcinolone. Encouraged BID use. If no improvement could try antibiotic if possible infection. No consistent with cellulitis.    Return if symptoms worsen or fail to improve.  Lesleigh Noe, MD  This visit occurred during the  SARS-CoV-2 public health emergency.  Safety protocols were in place, including screening questions prior to the visit, additional usage of staff PPE, and extensive cleaning of exam room while observing appropriate contact time as indicated for disinfecting solutions.

## 2020-01-15 NOTE — Assessment & Plan Note (Signed)
Reviewed recent testosterone in range, and normal HCT. Refill testosterone. Return 6 months.

## 2020-01-15 NOTE — Patient Instructions (Signed)
#  Rash - triamcinolone twice daily x 1 week or until gone - if not improved - mychart message and we can try topical antibiotic

## 2020-02-07 ENCOUNTER — Encounter: Payer: Self-pay | Admitting: Family Medicine

## 2020-02-07 ENCOUNTER — Ambulatory Visit: Payer: BC Managed Care – PPO | Admitting: Family Medicine

## 2020-02-07 ENCOUNTER — Other Ambulatory Visit: Payer: Self-pay

## 2020-02-07 VITALS — BP 128/82 | HR 80 | Temp 97.6°F | Wt 301.0 lb

## 2020-02-07 DIAGNOSIS — W57XXXA Bitten or stung by nonvenomous insect and other nonvenomous arthropods, initial encounter: Secondary | ICD-10-CM | POA: Diagnosis not present

## 2020-02-07 DIAGNOSIS — E782 Mixed hyperlipidemia: Secondary | ICD-10-CM

## 2020-02-07 DIAGNOSIS — S40862A Insect bite (nonvenomous) of left upper arm, initial encounter: Secondary | ICD-10-CM | POA: Diagnosis not present

## 2020-02-07 MED ORDER — MUPIROCIN 2 % EX OINT: 1.0000 "application " | TOPICAL_OINTMENT | Freq: Two times a day (BID) | CUTANEOUS | 0 refills | Status: DC

## 2020-02-07 NOTE — Progress Notes (Signed)
Subjective:     Carl Roberts is a 44 y.o. male presenting for Insect Bite (Left bicep x 5 days )     HPI  #Bite - thinks it happened Saturday or Sunday - itchy - did not see what he was bit by - treatment: wiping with alcohol - can see the bite marks but getting  - itchy sensation is beyond the bite area - noticing that it is getting slightly puffier   Review of Systems   Social History   Tobacco Use  Smoking Status Former Smoker  . Packs/day: 0.50  . Years: 4.00  . Pack years: 2.00  . Types: Cigarettes, Pipe  . Quit date: 08/03/1998  . Years since quitting: 21.5  Smokeless Tobacco Never Used        Objective:    BP Readings from Last 3 Encounters:  02/07/20 128/82  01/15/20 138/90  11/30/19 (!) 142/86   Wt Readings from Last 3 Encounters:  02/07/20 (!) 301 lb (136.5 kg)  01/15/20 (!) 304 lb 12 oz (138.2 kg)  11/30/19 (!) 311 lb 4 oz (141.2 kg)    BP 128/82   Pulse 80   Temp 97.6 F (36.4 C) (Temporal)   Wt (!) 301 lb (136.5 kg)   SpO2 96%   BMI 40.54 kg/m    Physical Exam Constitutional:      Appearance: Normal appearance. He is not ill-appearing or diaphoretic.  HENT:     Right Ear: External ear normal.     Left Ear: External ear normal.  Eyes:     General: No scleral icterus.    Extraocular Movements: Extraocular movements intact.     Conjunctiva/sclera: Conjunctivae normal.  Cardiovascular:     Rate and Rhythm: Normal rate.  Pulmonary:     Effort: Pulmonary effort is normal.  Musculoskeletal:     Cervical back: Neck supple.  Skin:    General: Skin is warm and dry.     Comments: Raised lesion with possible bite marks present on the left biceps.   Neurological:     Mental Status: He is alert. Mental status is at baseline.  Psychiatric:        Mood and Affect: Mood normal.      The 10-year ASCVD risk score [name], et al., 2013) is: 3%   Values used to calculate the score:     Age: 44 years     Sex: Male     Is  Non-Hispanic African American: No     Diabetic: No     Tobacco smoker: No     Systolic Blood Pressure: 710 mmHg     Is BP treated: No     HDL Cholesterol: 38.8 mg/dL     Total Cholesterol: 247 mg/dL      Assessment & Plan:   Problem List Items Addressed This Visit      Other   Hyperlipidemia, mixed    Reviewed ASCVD risk 3%. Discussed that additional tests could be done to assess risk further, but given current risk factors lifestyle changes are the first step. Pt working on healthy eating. Will repeat in 1 year.       Morbid obesity (HCC)    Weight loss of 10 lbs in 2 months. Pt working on healthy eating.        Other Visit Diagnoses    Insect bite of left upper arm, initial encounter    -  Primary   Relevant Medications   mupirocin ointment (  BACTROBAN) 2 %     Bite mark which is itch - advised steroid cream and if not improving given erythema advised mupirocin ointment for possible local infection. Call or mychart if worsening  Return if symptoms worsen or fail to improve.  Lesleigh Noe, MD  This visit occurred during the SARS-CoV-2 public health emergency.  Safety protocols were in place, including screening questions prior to the visit, additional usage of staff PPE, and extensive cleaning of exam room while observing appropriate contact time as indicated for disinfecting solutions.

## 2020-02-07 NOTE — Assessment & Plan Note (Signed)
Reviewed ASCVD risk 3%. Discussed that additional tests could be done to assess risk further, but given current risk factors lifestyle changes are the first step. Pt working on healthy eating. Will repeat in 1 year.

## 2020-02-07 NOTE — Assessment & Plan Note (Signed)
Weight loss of 10 lbs in 2 months. Pt working on healthy eating.

## 2020-02-07 NOTE — Patient Instructions (Signed)
Bug Bite - use Triamcinolone cream for 2 days  - if no improvement then start the topical antibiotic   Make sure you stretch muscles around the IT band

## 2020-04-08 ENCOUNTER — Encounter: Payer: Self-pay | Admitting: Family Medicine

## 2020-04-11 ENCOUNTER — Telehealth: Payer: Self-pay | Admitting: Family Medicine

## 2020-04-11 NOTE — Telephone Encounter (Signed)
Pt said his blood pressure is running high. He said he is going about 147/90 and 155/95. I asked and transferred him to access nurse to be triaged.

## 2020-04-11 NOTE — Telephone Encounter (Signed)
BP Readings from Last 3 Encounters:  02/07/20 128/82  01/15/20 138/90  11/30/19 (!) 142/86   Pt with hx of elevated blood pressure on occasion.   Would recommend home monitoring and if remaining high office visit in 1-2 weeks to discuss.   If CP, acute HA, vision changes - would recommend urgent evaluation.

## 2020-04-12 ENCOUNTER — Other Ambulatory Visit: Payer: Self-pay

## 2020-04-12 ENCOUNTER — Ambulatory Visit: Payer: Self-pay

## 2020-04-12 ENCOUNTER — Ambulatory Visit
Admission: EM | Admit: 2020-04-12 | Discharge: 2020-04-12 | Disposition: A | Discharge status: Home / Self Care | Payer: BC Managed Care – PPO | Attending: Emergency Medicine | Admitting: Emergency Medicine

## 2020-04-12 DIAGNOSIS — M79602 Pain in left arm: Secondary | ICD-10-CM

## 2020-04-12 DIAGNOSIS — R030 Elevated blood-pressure reading, without diagnosis of hypertension: Secondary | ICD-10-CM

## 2020-04-12 NOTE — Telephone Encounter (Signed)
Pt called back said the wait time in [place] at [facility] was 8 hrs. Pt wanted to go over again why needing to go to UC. Pt advised and voiced understanding. Pt said he could be back in [place] area in 2 hrs and pt wanted suggestions of local UC . Advised pt of [facility] [address], [address] in [place] and [facility] in [place]. Pt voiced understanding and said would probably go to [facility] on [address].

## 2020-04-12 NOTE — ED Triage Notes (Signed)
Pt states received his 2nd covid vaccine a wk ago and has felt "funny" since. Pt c/o lt arm numbness/tingling x3 days. Lt sided chest muscle "catch" when taking a deep breath x2 days. C/o tightness/numbness to lt side of neck up to face x2 days. States his b/p have been elevated since this all started. Denies chest pain or SOB

## 2020-04-12 NOTE — Telephone Encounter (Signed)
Noted. Suspect headache may not be related to his blood pressure, but probably a good idea to get evaluated.

## 2020-04-12 NOTE — Telephone Encounter (Signed)
[facility] - Client TELEPHONE ADVICE RECORD [facility] Patient Name: Carl Roberts Gender: Male DOB: 10/14/1975 Age: 44 Y 45 M 12 D Return Phone Number: [phone] (Primary), [phone] (Secondary) Address[place] [address] Client [facility] - Client Client Site [facility] Physician Waunita Schooner- MD Contact Type Call Who Is Calling Patient / Member / Family / Caregiver Call Type Triage / Clinical Caller Name Bridgett Relationship To Patient Other Return Phone Number [phone] (Primary) Chief Complaint Blood Pressure High Reason for Call Symptomatic / Request for [name] states that blood pressure is high 140/90 and 155/95 Translation No Nurse Assessment Nurse: D'Heur Lucia Gaskins, RN, Adrienne Date/Time (Eastern Time): 04/11/2020 5:18:44 PM Confirm and document reason for call. If symptomatic, describe symptoms. ---Caller states that blood pressure is high 140/90 and 155/95. Had covid vax #2 last Thursday. He has felt "funny" ever since. (light pressure headaches as per caller) Does the patient have any new or worsening symptoms? ---Yes Will a triage be completed? ---Yes Related visit to physician within the last 2 weeks? ---Yes Does the PT have any chronic conditions? (i.e. diabetes, asthma, this includes High risk factors for pregnancy, etc.) ---No Is this a behavioral health or substance abuse call? ---No Guidelines Guideline Title Affirmed Question Affirmed Notes Nurse Date/Time (Eastern Time) Blood Pressure - High [5] Systolic BP >= 035 OR Diastolic >= 80 AND [4] not taking BP medications D'Heur Lucia Gaskins, RN, Adrienne 04/11/2020 5:22:12 PM Disp. Time Eilene Ghazi Time) Disposition Final User 04/11/2020 5:30:26 PM See PCP within 2 Weeks Yes [place], RN, Ebbie Latus Disagree/Comply Comply Caller Understands Yes PLEASE NOTE:  All timestamps contained within this report are represented as [place] Standard Time. CONFIDENTIALTY NOTICE: This fax transmission is intended only for the addressee. It contains information that is legally privileged, confidential or otherwise protected from use or disclosure. If you are not the intended recipient, you are strictly prohibited from reviewing, disclosing, copying using or disseminating any of this information or taking any action in reliance on or regarding this information. If you have received this fax in error, please notify us immediately by telephone so that we can arrange for its return to [place]. Phone: [phone], Toll-Free: [phone], Fax: [phone] Page: 2 of 2 Call Id: [id number] PreDisposition Call Doctor Care Advice Given Per Guideline SEE PCP WITHIN 2 WEEKS: * You need to be seen for this ongoing problem within the next 2 weeks. * PCP VISIT: Call your doctor (or NP/PA) during regular office hours and make an appointment. * IF PATIENT HAS NO PCP: A primary care clinic is where you need to be seen for chronic health problems. NOTE: Try to help caller find a PCP (e.g., use a physician referral line). Having a PCP or 'medical home' means better long-term care. REASSURANCE AND EDUCATION: * You should see your doctor and have your blood pressure checked within 2 weeks. HIGH BLOOD PRESSURE - LIFESTYLE MODIFICATIONS: * The following things can help you reduce your blood pressure. * EAT HEALTHY: Eat a diet rich in fresh fruits and vegetables, dietary fiber, non-animal protein (e.g., soy), and low-fat dairy products. Avoid foods with a high content of saturated fat or cholesterol. * DECREASE SODIUM INTAKE: Aim to eat less than 2.4 g (100 mmol) of sodium each day. Unfortunately 75% of the salt in the average person's diet is in pre-processed foods. * LIMIT ALCOHOL: Limit alcoholic beverages to no more than one drink  per day for women and no more than two drinks for men. A drink  is 1.5 oz hard liquor (one shot or jigger; 45 ml), 5 oz wine (small glass; 150 ml), 12 oz beer (one can; 360 ml). * EXERCISE, BE MORE PHYSICALLY ACTIVE: Do at least 30 minutes of aerobic exercise (e.g., brisk walking) most days of the week. Other examples of aerobic activities cycling, jogging, and swimming. * REDUCE STRESS: Find activities that help reduce your stress. Examples might include meditation, yoga, or even a restful walk in a park. CALL BACK IF: * Weakness or numbness of the face, arm or leg on one side of the body occurs * Difficulty walking, difficulty talking, or severe headache occurs * Chest pain or difficulty breathing occurs * Your blood pressure is over 160/100 * You become worse CARE ADVICE given per High Blood Pressure (Adult) guideline. Comments User: Vincente Liberty , D'Heur Lucia Gaskins, RN Date/Time Eilene Ghazi Time): 04/11/2020 5:21:38 PM Caller states he felt "funny and slight tightness in left side of his chest after both vaccines. User: Vincente Liberty , D'Heur Lucia Gaskins, RN Date/Time Eilene Ghazi Time): 04/11/2020 5:24:09 PM Caller feels "mild pins and needles in left arm near vaccine site User: Vincente Liberty , D'Heur Lucia Gaskins, RN Date/Time Eilene Ghazi Time): 04/11/2020 5:25:41 PM Latest BP 154/90+. done at pharmacy about 45 minutes ago.

## 2020-04-12 NOTE — ED Provider Notes (Signed)
[facility]    CSN: [id number] Arrival date & time: 04/12/20  1404      History   Chief Complaint Chief Complaint  Patient presents with  . Hypertension    HPI Carl Roberts is a 44 y.o. male   Presenting for left arm discomfort s/p second Covid vaccine.  States he received this last Friday in his arm has "felt funny "since.  No weakness, palpitations, lightheadedness or dizziness.  Patient states sometimes it will extend up into his neck or jaw.  No dyspnea with exertion, lower leg swelling, nausea or vomiting, fever.  No myalgias or arthralgias.  Past Medical History:  Diagnosis Date  . Asthma   . Hyperlipidemia   . Hypertension   . Low testosterone   . Pituitary adenoma ([place]) 10/26/2011   Overview:  Last MRI normal 10/2011  . Stomach ulcer     Patient Active Problem List   Diagnosis Date Noted  . Morbid obesity ([place]) 02/07/2020  . Elevated blood pressure reading in office without diagnosis of hypertension 08/03/2018  . Hypogonadism in male 08/03/2018  . Hyperlipidemia, mixed 09/08/2016  . OSA on CPAP 10/27/2011  . Asthma 04/17/2011    Past Surgical History:  Procedure Laterality Date  . BACK SURGERY         Home Medications    Prior to Admission medications   Medication Sig Start Date End Date Taking? Authorizing Provider  NEEDLE, DISP, 25 G (BD HYPODERMIC NEEDLE) 25G X 1-1/2" MISC USE 1 EACH EVERY 14 (FOURTEEN) DAYS (FOR ADMINISTERING THE MEDICATION) 11/30/19   Lesleigh Noe, MD  testosterone cypionate (DEPOTESTOSTERONE CYPIONATE) 200 MG/ML injection Inject 1 mL (200 mg total) into the muscle every 14 (fourteen) days. 01/15/20   Lesleigh Noe, MD    Family History Family History  Problem Relation Age of Onset  . Arthritis Mother   . Diabetes Mother   . Arthritis Father   . Glaucoma Father   . Asthma Maternal Grandmother   . Lung cancer Maternal Grandmother   . COPD Maternal Grandfather   . Heart disease Maternal Grandfather   .  Hyperlipidemia Maternal Grandfather   . Heart attack Maternal Grandfather 55  . Alzheimer's disease Paternal Grandmother   . Parkinson's disease Paternal Grandfather   . Heart attack Paternal Grandfather 1  . Allergies Son   . Hypertension Brother   . Hyperlipidemia Brother     Social History Social History   Tobacco Use  . Smoking status: Former Smoker    Packs/day: 0.50    Years: 4.00    Pack years: 2.00    Types: Cigarettes, Pipe    Quit date: 08/03/1998    Years since quitting: 21.7  . Smokeless tobacco: Never Used  Vaping Use  . Vaping Use: Never used  Substance Use Topics  . Alcohol use: Yes    Comment: 1 glass of wine, liquor on the weekends  . Drug use: No     Allergies   Naproxen and Shellfish allergy   Review of Systems As per HPI   Physical Exam Triage Vital Signs ED Triage Vitals  Enc Vitals Group     BP 04/12/20 1421 (!) 147/93     Pulse Rate 04/12/20 1421 67     Resp 04/12/20 1421 18     Temp 04/12/20 1421 98.1 F (36.7 C)     Temp Source 04/12/20 1421 Oral     SpO2 04/12/20 1421 98 %     Weight --  Height --      Head Circumference --      Peak Flow --      Pain Score 04/12/20 1422 1     Pain Loc --      Pain Edu? --      Excl. in [place]? --    No data found.  Updated Vital Signs BP (!) 147/93 (BP Location: Left Arm)   Pulse 67   Temp 98.1 F (36.7 C) (Oral)   Resp 18   SpO2 98%   Visual Acuity Right Eye Distance:   Left Eye Distance:   Bilateral Distance:    Right Eye Near:   Left Eye Near:    Bilateral Near:     Physical Exam Constitutional:      General: He is not in acute distress.    Appearance: He is not toxic-appearing or diaphoretic.  HENT:     Head: Normocephalic and atraumatic.     Mouth/Throat:     Mouth: Mucous membranes are moist.     Pharynx: Oropharynx is clear.  Eyes:     General: No scleral icterus.    Conjunctiva/sclera: Conjunctivae normal.     Pupils: Pupils are equal, round, and reactive to  light.  Neck:     Comments: Trachea midline, negative JVD.  Left trap w/ TTp Cardiovascular:     Rate and Rhythm: Normal rate and regular rhythm.  Pulmonary:     Effort: Pulmonary effort is normal. No respiratory distress.     Breath sounds: No wheezing.  Musculoskeletal:        General: No swelling. Normal range of motion.     Cervical back: Neck supple. Tenderness present.     Comments: Upper extremities btl: nvi  Lymphadenopathy:     Cervical: No cervical adenopathy.  Skin:    Capillary Refill: Capillary refill takes less than 2 seconds.     Coloration: Skin is not jaundiced or pale.     Findings: No rash.  Neurological:     Mental Status: He is alert and oriented to person, place, and time.      UC Treatments / Results  Labs (all labs ordered are listed, but only abnormal results are displayed) Labs Reviewed - No data to display  EKG   Radiology No results found.  Procedures Procedures (including critical care time)  Medications Ordered in UC Medications - No data to display  Initial Impression / Assessment and Plan / UC Course  I have reviewed the triage vital signs and the nursing notes.  Pertinent labs & imaging results that were available during my care of the patient were reviewed by me and considered in my medical decision making (see chart for details).     Afebrile, nontoxic, hemodynamically stable in office.  EKG done in office, reviewed by me and compared to previous from 09/08/2016: NSR with ventricular at 60 bpm no QTC prolongation, ST elevation or depression.  Waveforms stable in all leads: Nonacute.  Likely MSK second to localized edema from shot.  Provided reassurance, anticipated course of recovery.  ER return precautions discussed, pt verbalized understanding and is agreeable to plan. Final Clinical Impressions(s) / UC Diagnoses   Final diagnoses:  Left arm pain  Elevated blood-pressure reading, without diagnosis of hypertension      Discharge Instructions     Ibuprofen and ice for pain. Check BP no more than once daily. Follow up with PCP for further evaluation of BP readings. Go to ER for worsening pain, difficulty  breathing, or dizziness.    ED Prescriptions    None     PDMP not reviewed this encounter.   Hall[place], [place], [place] 04/12/20 1700

## 2020-04-12 NOTE — Telephone Encounter (Signed)
I spoke with pt; and pt advised per DR Cody's note and pt voiced understanding. Pt said no CP but has soreness across chest into lt arm; pt has had H/A on and off x 3 since 2nd covid vaccine and pt has pressure in back of head. No vision issues but lt arm feels like dead weight. Pt is presently in [place] and will go to UC there. FYI to Dr Einar Pheasant.

## 2020-04-12 NOTE — Discharge Instructions (Signed)
Ibuprofen and ice for pain. Check BP no more than once daily. Follow up with PCP for further evaluation of BP readings. Go to ER for worsening pain, difficulty breathing, or dizziness.

## 2020-05-06 ENCOUNTER — Other Ambulatory Visit: Payer: Self-pay

## 2020-05-06 ENCOUNTER — Ambulatory Visit: Payer: BC Managed Care – PPO | Admitting: Family Medicine

## 2020-05-06 VITALS — BP 130/100 | HR 71 | Temp 98.4°F | Wt 309.8 lb

## 2020-05-06 DIAGNOSIS — E782 Mixed hyperlipidemia: Secondary | ICD-10-CM

## 2020-05-06 DIAGNOSIS — R7989 Other specified abnormal findings of blood chemistry: Secondary | ICD-10-CM | POA: Diagnosis not present

## 2020-05-06 DIAGNOSIS — I10 Essential (primary) hypertension: Secondary | ICD-10-CM

## 2020-05-06 MED ORDER — LISINOPRIL 10 MG PO TABS: 10.0000 mg | ORAL_TABLET | Freq: Every day | ORAL | 3 refills | Status: DC

## 2020-05-06 NOTE — Assessment & Plan Note (Signed)
BP high at home and in the office. Discussed life style changes and pt is motivated to make changes as well as starting bp medication. Repeat labs in 1 month

## 2020-05-06 NOTE — Progress Notes (Signed)
Subjective:     Carl Roberts is a 44 y.o. male presenting for Follow-up (urgent care visit x 3 weeks )     HPI  #HTN - Weight stable 304-306 at home - not currently on medication - on the last few office visits had 3/12 which are high - 130-140s/80-90s - endorses some stress around high blood pressure - no cp, sob, ha - did throw his back out last week  Review of Systems  04/12/2020: Urgent care - arm pain s/p Covid vaccine - EKG reassuring. Suspect MSK, BP elevated - home monitoring  Social History   Tobacco Use  Smoking Status Former Smoker  . Packs/day: 0.50  . Years: 4.00  . Pack years: 2.00  . Types: Cigarettes, Pipe  . Quit date: 08/03/1998  . Years since quitting: 21.7  Smokeless Tobacco Never Used        Objective:    BP Readings from Last 3 Encounters:  05/06/20 (!) 130/100  04/12/20 (!) 147/93  02/07/20 128/82   Wt Readings from Last 3 Encounters:  05/06/20 (!) 309 lb 12 oz (140.5 kg)  02/07/20 (!) 301 lb (136.5 kg)  01/15/20 (!) 304 lb 12 oz (138.2 kg)    BP (!) 130/100   Pulse 71   Temp 98.4 F (36.9 C) (Temporal)   Wt (!) 309 lb 12 oz (140.5 kg)   SpO2 97%   BMI 41.72 kg/m    Physical Exam Constitutional:      Appearance: Normal appearance. He is not ill-appearing or diaphoretic.  HENT:     Right Ear: External ear normal.     Left Ear: External ear normal.     Nose: Nose normal.  Eyes:     General: No scleral icterus.    Extraocular Movements: Extraocular movements intact.     Conjunctiva/sclera: Conjunctivae normal.  Cardiovascular:     Rate and Rhythm: Normal rate and regular rhythm.     Heart sounds: No murmur heard.   Pulmonary:     Effort: Pulmonary effort is normal. No respiratory distress.     Breath sounds: Normal breath sounds. No wheezing.  Musculoskeletal:     Cervical back: Neck supple.  Skin:    General: Skin is warm and dry.  Neurological:     Mental Status: He is alert. Mental status is at baseline.    Psychiatric:        Mood and Affect: Mood normal.      The 10-year ASCVD risk score [name], et al., 2013) is: 3.6%   Values used to calculate the score:     Age: 43 years     Sex: Male     Is Non-Hispanic African American: No     Diabetic: No     Tobacco smoker: No     Systolic Blood Pressure: 712 mmHg     Is BP treated: Yes     HDL Cholesterol: 38.8 mg/dL     Total Cholesterol: 247 mg/dL     Assessment & Plan:   Problem List Items Addressed This Visit      Cardiovascular and Mediastinum   Essential hypertension - Primary    BP high at home and in the office. Discussed life style changes and pt is motivated to make changes as well as starting bp medication. Repeat labs in 1 month      Relevant Medications   lisinopril (ZESTRIL) 10 MG tablet   Other Relevant Orders   TSH   Basic  metabolic panel     Other   Hyperlipidemia, mixed    Elevated but not meeting threshold for treatment. Continue to monitor - work on weight loss      Relevant Medications   lisinopril (ZESTRIL) 10 MG tablet   Morbid obesity ([place])    Encouraged weight loss. Pt will work on diet/exercise       Other Visit Diagnoses    Low vitamin D level       Relevant Orders   Vitamin D, 25-hydroxy       Return in about 3 months (around 08/06/2020).  Lesleigh Noe, MD  This visit occurred during the SARS-CoV-2 public health emergency.  Safety protocols were in place, including screening questions prior to the visit, additional usage of staff PPE, and extensive cleaning of exam room while observing appropriate contact time as indicated for disinfecting solutions.

## 2020-05-06 NOTE — Assessment & Plan Note (Signed)
Encouraged weight loss. Pt will work on diet/exercise

## 2020-05-06 NOTE — Patient Instructions (Addendum)
Start lisinopril 10 mg daily  MyChart message in 2 weeks      Your blood pressure high.   High blood pressure increases your risk for heart attack and stroke.    Please check your blood pressure 2-4 times a week.   To check your blood pressure 1) Sit in a quiet and relaxed place for 5 minutes 2) Make sure your feet are flat on the ground 3) Consider checking first thing in the morning   Normal blood pressure is less than 140/90 Ideally you blood pressure should be around 120/80  Other ways you can reduce your blood pressure:  1) Regular exercise -- Try to get 150 minutes (30 minutes, 5 days a week) of moderate to vigorous aerobic excercise -- Examples: brisk walking (2.5 miles per hour), water aerobics, dancing, gardening, tennis, biking slower than 10 miles per hour 2) DASH Diet - low fat meats, more fresh fruits and vegetables, whole grains, low salt 3) Quit smoking if you smoke 4) Loose 5-10% of your body weight   DASH Eating Plan DASH stands for "Dietary Approaches to Stop Hypertension." The DASH eating plan is a healthy eating plan that has been shown to reduce high blood pressure (hypertension). It may also reduce your risk for type 2 diabetes, heart disease, and stroke. The DASH eating plan may also help with weight loss. What are tips for following this plan?  General guidelines  Avoid eating more than 2,300 mg (milligrams) of salt (sodium) a day. If you have hypertension, you may need to reduce your sodium intake to 1,500 mg a day.  Limit alcohol intake to no more than 1 drink a day for nonpregnant women and 2 drinks a day for men. One drink equals 12 oz of beer, 5 oz of wine, or 1 oz of hard liquor.  Work with your health care provider to maintain a healthy body weight or to lose weight. Ask what an ideal weight is for you.  Get at least 30 minutes of exercise that causes your heart to beat faster (aerobic exercise) most days of the week. Activities may include  walking, swimming, or biking.  Work with your health care provider or diet and nutrition specialist (dietitian) to adjust your eating plan to your individual calorie needs. Reading food labels   Check food labels for the amount of sodium per serving. Choose foods with less than 5 percent of the Daily Value of sodium. Generally, foods with less than 300 mg of sodium per serving fit into this eating plan.  To find whole grains, look for the word "whole" as the first word in the ingredient list. Shopping  Buy products labeled as "low-sodium" or "no salt added."  Buy fresh foods. Avoid canned foods and premade or frozen meals. Cooking  Avoid adding salt when cooking. Use salt-free seasonings or herbs instead of table salt or sea salt. Check with your health care provider or pharmacist before using salt substitutes.  Do not fry foods. Cook foods using healthy methods such as baking, boiling, grilling, and broiling instead.  Cook with heart-healthy oils, such as olive, canola, soybean, or sunflower oil. Meal planning  Eat a balanced diet that includes: ? 5 or more servings of fruits and vegetables each day. At each meal, try to fill half of your plate with fruits and vegetables. ? Up to 6-8 servings of whole grains each day. ? Less than 6 oz of lean meat, poultry, or fish each day. A 3-oz serving of meat  is about the same size as a deck of cards. One egg equals 1 oz. ? 2 servings of low-fat dairy each day. ? A serving of nuts, seeds, or beans 5 times each week. ? Heart-healthy fats. Healthy fats called Omega-3 fatty acids are found in foods such as flaxseeds and coldwater fish, like sardines, salmon, and mackerel.  Limit how much you eat of the following: ? Canned or prepackaged foods. ? Food that is high in trans fat, such as fried foods. ? Food that is high in saturated fat, such as fatty meat. ? Sweets, desserts, sugary drinks, and other foods with added sugar. ? Full-fat dairy  products.  Do not salt foods before eating.  Try to eat at least 2 vegetarian meals each week.  Eat more home-cooked food and less restaurant, buffet, and fast food.  When eating at a restaurant, ask that your food be prepared with less salt or no salt, if possible. What foods are recommended? The items listed may not be a complete list. Talk with your dietitian about what dietary choices are best for you. Grains Whole-grain or whole-wheat bread. Whole-grain or whole-wheat pasta. Brown rice. [place]. Bulgur. Whole-grain and low-sodium cereals. Pita bread. Low-fat, low-sodium crackers. Whole-wheat flour tortillas. Vegetables Fresh or frozen vegetables (raw, steamed, roasted, or grilled). Low-sodium or reduced-sodium tomato and vegetable juice. Low-sodium or reduced-sodium tomato sauce and tomato paste. Low-sodium or reduced-sodium canned vegetables. Fruits All fresh, dried, or frozen fruit. Canned fruit in natural juice (without added sugar). Meat and other protein foods Skinless chicken or [place]. Ground chicken or [place]. Pork with fat trimmed off. Fish and seafood. Egg whites. Dried beans, peas, or lentils. Unsalted nuts, nut butters, and seeds. Unsalted canned beans. Lean cuts of beef with fat trimmed off. Low-sodium, lean deli meat. Dairy Low-fat (1%) or fat-free (skim) milk. Fat-free, low-fat, or reduced-fat cheeses. Nonfat, low-sodium ricotta or cottage cheese. Low-fat or nonfat yogurt. Low-fat, low-sodium cheese. Fats and oils Soft margarine without trans fats. Vegetable oil. Low-fat, reduced-fat, or light mayonnaise and salad dressings (reduced-sodium). Canola, safflower, olive, soybean, and sunflower oils. Avocado. Seasoning and other foods Herbs. Spices. Seasoning mixes without salt. Unsalted popcorn and pretzels. Fat-free sweets. What foods are not recommended? The items listed may not be a complete list. Talk with your dietitian about what dietary choices are best for  you. Grains Baked goods made with fat, such as croissants, muffins, or some breads. Dry pasta or rice meal packs. Vegetables Creamed or fried vegetables. Vegetables in a cheese sauce. Regular canned vegetables (not low-sodium or reduced-sodium). Regular canned tomato sauce and paste (not low-sodium or reduced-sodium). Regular tomato and vegetable juice (not low-sodium or reduced-sodium). Angie Fava. Olives. Fruits Canned fruit in a light or heavy syrup. Fried fruit. Fruit in cream or butter sauce. Meat and other protein foods Fatty cuts of meat. Ribs. Fried meat. Berniece Salines. Sausage. Bologna and other processed lunch meats. Salami. Fatback. Hotdogs. Bratwurst. Salted nuts and seeds. Canned beans with added salt. Canned or smoked fish. Whole eggs or egg yolks. Chicken or [place] with skin. Dairy Whole or 2% milk, cream, and half-and-half. Whole or full-fat cream cheese. Whole-fat or sweetened yogurt. Full-fat cheese. Nondairy creamers. Whipped toppings. Processed cheese and cheese spreads. Fats and oils Butter. Stick margarine. Lard. Shortening. Ghee. Bacon fat. Tropical oils, such as coconut, palm kernel, or palm oil. Seasoning and other foods Salted popcorn and pretzels. Onion salt, garlic salt, seasoned salt, table salt, and sea salt. Worcestershire sauce. Tartar sauce. Barbecue sauce. Teriyaki sauce.  Soy sauce, including reduced-sodium. Steak sauce. Canned and packaged gravies. Fish sauce. Oyster sauce. Cocktail sauce. Horseradish that you find on the shelf. Ketchup. Mustard. Meat flavorings and tenderizers. Bouillon cubes. Hot sauce and Tabasco sauce. Premade or packaged marinades. Premade or packaged taco seasonings. Relishes. Regular salad dressings. Where to find more information:  National Heart, Lung, and [place]: https://wilson-eaton.com/  American Heart Association: www.heart.org Summary  The DASH eating plan is a healthy eating plan that has been shown to reduce high blood pressure  (hypertension). It may also reduce your risk for type 2 diabetes, heart disease, and stroke.  With the DASH eating plan, you should limit salt (sodium) intake to 2,300 mg a day. If you have hypertension, you may need to reduce your sodium intake to 1,500 mg a day.  When on the DASH eating plan, aim to eat more fresh fruits and vegetables, whole grains, lean proteins, low-fat dairy, and heart-healthy fats.  Work with your health care provider or diet and nutrition specialist (dietitian) to adjust your eating plan to your individual calorie needs. This information is not intended to replace advice given to you by your health care provider. Make sure you discuss any questions you have with your health care provider. Document Revised: 06/18/2017 Document Reviewed: 06/29/2016 Elsevier Patient Education  2020 [employer].

## 2020-05-06 NOTE — Assessment & Plan Note (Signed)
Elevated but not meeting threshold for treatment. Continue to monitor - work on weight loss

## 2020-05-22 ENCOUNTER — Encounter: Payer: Self-pay | Admitting: Family Medicine

## 2020-06-06 ENCOUNTER — Other Ambulatory Visit: Payer: BC Managed Care – PPO

## 2020-07-15 ENCOUNTER — Telehealth: Payer: Self-pay | Admitting: Family Medicine

## 2020-07-15 NOTE — Telephone Encounter (Signed)
Please call patient and see if he is interested in monoclonal antibody infusion.   If he is we can send a message to the team that coordinates.   Otherwise, review isolation guidelines with patient and ER precautions. Can do virtual visit if he has more questions or concerns.

## 2020-07-15 NOTE — Telephone Encounter (Signed)
Patient called in stating he tested positive for COVID. Patient is wanting to know what the next steps should be for him. Please advise. EM

## 2020-07-16 NOTE — Telephone Encounter (Signed)
Noted. Agreed.

## 2020-07-16 NOTE — Telephone Encounter (Signed)
Spoke to pt and asked if he was interested in the MAI. Pt states that he doesn't feel like he is sick enough to take that from someone who could really use it. Pt's onset date was 12/26. Fever free since AM of 12/27. O2 running between 95-98%. Mucous is clear. Pt states he is taking zinc and vitamin D. I suggested elderberry and mucinex DM if he gets more congested. Pt is quarantining with family and understands isolation guidelines and Er precautions. Will call us back if he feels like he is not improving.

## 2020-07-22 ENCOUNTER — Encounter: Payer: Self-pay | Admitting: Family Medicine

## 2020-08-28 ENCOUNTER — Other Ambulatory Visit: Payer: Self-pay | Admitting: Family Medicine

## 2020-08-28 DIAGNOSIS — E291 Testicular hypofunction: Secondary | ICD-10-CM

## 2020-08-29 NOTE — Telephone Encounter (Signed)
Last OV: 05/06/20 Last refill: 01/15/20 24ml / 0 refills   No future appt scheduled

## 2020-09-06 DIAGNOSIS — M25561 Pain in right knee: Secondary | ICD-10-CM | POA: Diagnosis not present

## 2020-09-06 DIAGNOSIS — M1711 Unilateral primary osteoarthritis, right knee: Secondary | ICD-10-CM | POA: Diagnosis not present

## 2020-09-06 DIAGNOSIS — S83241A Other tear of medial meniscus, current injury, right knee, initial encounter: Secondary | ICD-10-CM | POA: Diagnosis not present

## 2020-09-06 DIAGNOSIS — M25461 Effusion, right knee: Secondary | ICD-10-CM | POA: Diagnosis not present

## 2020-09-11 DIAGNOSIS — S8391XA Sprain of unspecified site of right knee, initial encounter: Secondary | ICD-10-CM | POA: Diagnosis not present

## 2020-09-17 DIAGNOSIS — S83281D Other tear of lateral meniscus, current injury, right knee, subsequent encounter: Secondary | ICD-10-CM | POA: Diagnosis not present

## 2020-09-24 DIAGNOSIS — M23321 Other meniscus derangements, posterior horn of medial meniscus, right knee: Secondary | ICD-10-CM | POA: Diagnosis not present

## 2020-09-24 DIAGNOSIS — M25461 Effusion, right knee: Secondary | ICD-10-CM | POA: Diagnosis not present

## 2020-09-24 DIAGNOSIS — S83281D Other tear of lateral meniscus, current injury, right knee, subsequent encounter: Secondary | ICD-10-CM | POA: Diagnosis not present

## 2020-09-24 DIAGNOSIS — S83281A Other tear of lateral meniscus, current injury, right knee, initial encounter: Secondary | ICD-10-CM | POA: Diagnosis not present

## 2020-09-24 DIAGNOSIS — M948X6 Other specified disorders of cartilage, lower leg: Secondary | ICD-10-CM | POA: Diagnosis not present

## 2020-09-26 DIAGNOSIS — S83241A Other tear of medial meniscus, current injury, right knee, initial encounter: Secondary | ICD-10-CM | POA: Diagnosis not present

## 2020-11-04 DIAGNOSIS — M25561 Pain in right knee: Secondary | ICD-10-CM | POA: Diagnosis not present

## 2020-11-04 DIAGNOSIS — M25461 Effusion, right knee: Secondary | ICD-10-CM | POA: Diagnosis not present

## 2020-11-04 DIAGNOSIS — S83241D Other tear of medial meniscus, current injury, right knee, subsequent encounter: Secondary | ICD-10-CM | POA: Diagnosis not present

## 2020-11-08 ENCOUNTER — Telehealth: Payer: Self-pay

## 2020-11-08 NOTE — Telephone Encounter (Signed)
Pt called in requesting order for CPAP machine... pt advised appt is likely req and may need referral to [facility] for sleep study and proper settings... pt has appt scheduled 11/12/2020... pt wanted to make sure if he definitely needed to be seen for order or referral  Please advise

## 2020-11-08 NOTE — Telephone Encounter (Signed)
We can discuss this at our visit.   It looked like the last time we discussed it he still had a cpap machine but had not used it recently.

## 2020-11-11 NOTE — Telephone Encounter (Signed)
Attempted to call pt and relay Dr. Verda Cumins message. No answer and pt's VM box is full and not accepting messages. Pt is scheduled for appt tomorrow 11/12/20.

## 2020-11-12 ENCOUNTER — Ambulatory Visit: Payer: BC Managed Care – PPO | Admitting: Family Medicine

## 2020-11-14 NOTE — Telephone Encounter (Signed)
Pt rescheduled appt for 11/18/20

## 2020-11-18 ENCOUNTER — Other Ambulatory Visit: Payer: Self-pay

## 2020-11-18 ENCOUNTER — Ambulatory Visit: Payer: BC Managed Care – PPO | Admitting: Family Medicine

## 2020-11-18 VITALS — BP 140/90 | HR 83 | Temp 96.8°F | Ht 72.0 in | Wt 313.8 lb

## 2020-11-18 DIAGNOSIS — I10 Essential (primary) hypertension: Secondary | ICD-10-CM | POA: Diagnosis not present

## 2020-11-18 DIAGNOSIS — Z9989 Dependence on other enabling machines and devices: Secondary | ICD-10-CM

## 2020-11-18 DIAGNOSIS — E291 Testicular hypofunction: Secondary | ICD-10-CM

## 2020-11-18 DIAGNOSIS — E782 Mixed hyperlipidemia: Secondary | ICD-10-CM

## 2020-11-18 DIAGNOSIS — G4733 Obstructive sleep apnea (adult) (pediatric): Secondary | ICD-10-CM | POA: Diagnosis not present

## 2020-11-18 MED ORDER — LISINOPRIL-HYDROCHLOROTHIAZIDE 20-12.5 MG PO TABS: 1.0000 | ORAL_TABLET | Freq: Every day | ORAL | 3 refills | Status: DC

## 2020-11-18 MED ORDER — TESTOSTERONE CYPIONATE 200 MG/ML IM SOLN: 200.0000 mg | INTRAMUSCULAR | 0 refills | Status: DC

## 2020-11-18 NOTE — Progress Notes (Signed)
Subjective:     Carl Roberts is a 45 y.o. male presenting for Referral (Sleep study ) and Obstructive Sleep Apnea (Needs new CPAP )     HPI  #OSA - started using his prior CPAP machine for the last year - but it broke recently - noticed a significant - setting was 10  - machine Resmed  #HTN - taking lisinopril 20 mg - not checking at home -    Review of Systems   Social History   Tobacco Use  Smoking Status Former Smoker  . Packs/day: 0.50  . Years: 4.00  . Pack years: 2.00  . Types: Cigarettes, Pipe  . Quit date: 08/03/1998  . Years since quitting: 22.3  Smokeless Tobacco Never Used        Objective:    BP Readings from Last 3 Encounters:  11/18/20 140/90  05/06/20 (!) 130/100  04/12/20 (!) 147/93   Wt Readings from Last 3 Encounters:  11/18/20 (!) 313 lb 12 oz (142.3 kg)  05/06/20 (!) 309 lb 12 oz (140.5 kg)  02/07/20 (!) 301 lb (136.5 kg)    BP 140/90   Pulse 83   Temp (!) 96.8 F (36 C) (Temporal)   Ht 6' (1.829 m)   Wt (!) 313 lb 12 oz (142.3 kg)   SpO2 98%   BMI 42.55 kg/m    Physical Exam Constitutional:      Appearance: Normal appearance. He is not ill-appearing or diaphoretic.  HENT:     Right Ear: External ear normal.     Left Ear: External ear normal.  Eyes:     General: No scleral icterus.    Extraocular Movements: Extraocular movements intact.     Conjunctiva/sclera: Conjunctivae normal.  Cardiovascular:     Rate and Rhythm: Normal rate and regular rhythm.     Heart sounds: No murmur heard.   Pulmonary:     Effort: Pulmonary effort is normal. No respiratory distress.     Breath sounds: Normal breath sounds. No wheezing.  Musculoskeletal:     Cervical back: Neck supple.  Skin:    General: Skin is warm and dry.  Neurological:     Mental Status: He is alert. Mental status is at baseline.  Psychiatric:        Mood and Affect: Mood normal.     The 10-year ASCVD risk score [name], et al., 2013) is: 4.5%    Values used to calculate the score:     Age: 5 years     Sex: Male     Is Non-Hispanic African American: No     Diabetic: No     Tobacco smoker: No     Systolic Blood Pressure: 169 mmHg     Is BP treated: Yes     HDL Cholesterol: 38.8 mg/dL     Total Cholesterol: 247 mg/dL       Assessment & Plan:   Problem List Items Addressed This Visit      Cardiovascular and Mediastinum   Essential hypertension    BP not at goal. Stop Lisinopril. Start Lisinopril-HCTZ 20-12.5 mg. Return for labs in 2-3 weeks. Return for appt in 3 months.       Relevant Medications   lisinopril-hydrochlorothiazide (ZESTORETIC) 20-12.5 MG tablet   Other Relevant Orders   Comprehensive metabolic panel   TSH     Respiratory   OSA on CPAP - Primary    Stable. Was doing well on setting of 10 but his machine  broke. Will try to get new machine. Discussed this may require updated sleep study but will avoid if we can. Restart CPAP once received.       Relevant Orders   For home use only DME continuous positive airway pressure (CPAP)     Endocrine   Hypogonadism in male    Doing well on testosterone. Cont 200 mg q14 days. Return for labs mid-cycle.       Relevant Medications   testosterone cypionate (DEPOTESTOSTERONE CYPIONATE) 200 MG/ML injection   Other Relevant Orders   CBC   Testosterone     Other   Hyperlipidemia, mixed    Below threshold for statin. Cont healthy diet. Recheck cholesterol.       Relevant Medications   lisinopril-hydrochlorothiazide (ZESTORETIC) 20-12.5 MG tablet   Other Relevant Orders   Lipid panel       Return in about 3 months (around 02/18/2021) for blood pressure.  Lesleigh Noe, MD  This visit occurred during the SARS-CoV-2 public health emergency.  Safety protocols were in place, including screening questions prior to the visit, additional usage of staff PPE, and extensive cleaning of exam room while observing appropriate contact time as indicated for  disinfecting solutions.

## 2020-11-18 NOTE — Assessment & Plan Note (Signed)
Below threshold for statin. Cont healthy diet. Recheck cholesterol.

## 2020-11-18 NOTE — Assessment & Plan Note (Signed)
BP not at goal. Stop Lisinopril. Start Lisinopril-HCTZ 20-12.5 mg. Return for labs in 2-3 weeks. Return for appt in 3 months.

## 2020-11-18 NOTE — Patient Instructions (Addendum)
#   Blood pressure - start new medication  - stop lisinopril (because this is in the new pill) - return for labs in 2 weeks  Return for blood work midway between your testosterone injection between 8-10 am  -- fasting since we will check cholesterol

## 2020-11-18 NOTE — Assessment & Plan Note (Signed)
Stable. Was doing well on setting of 10 but his machine broke. Will try to get new machine. Discussed this may require updated sleep study but will avoid if we can. Restart CPAP once received.

## 2020-11-18 NOTE — Assessment & Plan Note (Signed)
Doing well on testosterone. Cont 200 mg q14 days. Return for labs mid-cycle.

## 2020-12-02 ENCOUNTER — Other Ambulatory Visit: Payer: Self-pay

## 2020-12-02 ENCOUNTER — Other Ambulatory Visit: Payer: Self-pay | Admitting: Family Medicine

## 2020-12-02 ENCOUNTER — Other Ambulatory Visit (INDEPENDENT_AMBULATORY_CARE_PROVIDER_SITE_OTHER): Payer: BC Managed Care – PPO

## 2020-12-02 DIAGNOSIS — E782 Mixed hyperlipidemia: Secondary | ICD-10-CM

## 2020-12-02 DIAGNOSIS — E291 Testicular hypofunction: Secondary | ICD-10-CM | POA: Diagnosis not present

## 2020-12-02 DIAGNOSIS — R7989 Other specified abnormal findings of blood chemistry: Secondary | ICD-10-CM | POA: Diagnosis not present

## 2020-12-02 DIAGNOSIS — I10 Essential (primary) hypertension: Secondary | ICD-10-CM

## 2020-12-02 DIAGNOSIS — E559 Vitamin D deficiency, unspecified: Secondary | ICD-10-CM

## 2020-12-02 LAB — COMPREHENSIVE METABOLIC PANEL
ALT: 40 U/L (ref 0–53)
AST: 12 U/L (ref 0–37)
Albumin: 4.7 g/dL (ref 3.5–5.2)
Alkaline Phosphatase: 70 U/L (ref 39–117)
BUN: 12 mg/dL (ref 6–23)
CO2: 28 mEq/L (ref 19–32)
Calcium: 9.6 mg/dL (ref 8.4–10.5)
Chloride: 101 mEq/L (ref 96–112)
Creatinine, Ser: 0.94 mg/dL (ref 0.40–1.50)
GFR: 99.58 mL/min (ref >60.00)
Glucose, Bld: 85 mg/dL (ref 70–99)
Potassium: 4.6 mEq/L (ref 3.5–5.1)
Sodium: 140 mEq/L (ref 135–145)
Total Bilirubin: 0.5 mg/dL (ref 0.2–1.2)
Total Protein: 7.3 g/dL (ref 6.0–8.3)

## 2020-12-02 LAB — LIPID PANEL
Cholesterol: 265 mg/dL — ABNORMAL HIGH (ref 0–200)
HDL: 37.3 mg/dL — ABNORMAL LOW (ref >39.00)
Total CHOL/HDL Ratio: 7
Triglycerides: 480 mg/dL — ABNORMAL HIGH (ref 0.0–149.0)

## 2020-12-02 LAB — CBC
HCT: 45.3 % (ref 39.0–52.0)
Hemoglobin: 15.6 g/dL (ref 13.0–17.0)
MCHC: 34.4 g/dL (ref 30.0–36.0)
MCV: 92.7 fl (ref 78.0–100.0)
Platelets: 243 10*3/uL (ref 150.0–400.0)
RBC: 4.89 Mil/uL (ref 4.22–5.81)
RDW: 14 % (ref 11.5–15.5)
WBC: 5.7 10*3/uL (ref 4.0–10.5)

## 2020-12-02 LAB — TSH: TSH: 1.81 u[IU]/mL (ref 0.35–4.50)

## 2020-12-02 LAB — LDL CHOLESTEROL, DIRECT: Direct LDL: 137 mg/dL

## 2020-12-02 LAB — TESTOSTERONE: Testosterone: 429.05 ng/dL (ref 300.00–890.00)

## 2020-12-02 LAB — VITAMIN D 25 HYDROXY (VIT D DEFICIENCY, FRACTURES): VITD: 14.74 ng/mL — ABNORMAL LOW (ref 30.00–100.00)

## 2020-12-02 MED ORDER — CHOLECALCIFEROL 1.25 MG (50000 UT) PO TABS: 1.0000 | ORAL_TABLET | ORAL | 1 refills | Status: DC

## 2020-12-06 ENCOUNTER — Encounter: Payer: Self-pay | Admitting: Family Medicine

## 2020-12-08 ENCOUNTER — Other Ambulatory Visit: Payer: Self-pay | Admitting: Family Medicine

## 2020-12-08 DIAGNOSIS — L2481 Irritant contact dermatitis due to metals: Secondary | ICD-10-CM

## 2020-12-08 DIAGNOSIS — L249 Irritant contact dermatitis, unspecified cause: Secondary | ICD-10-CM

## 2020-12-26 DIAGNOSIS — M25561 Pain in right knee: Secondary | ICD-10-CM | POA: Diagnosis not present

## 2020-12-30 ENCOUNTER — Encounter: Payer: Self-pay | Admitting: Family Medicine

## 2021-01-13 DIAGNOSIS — M1711 Unilateral primary osteoarthritis, right knee: Secondary | ICD-10-CM | POA: Diagnosis not present

## 2021-01-13 DIAGNOSIS — S83241D Other tear of medial meniscus, current injury, right knee, subsequent encounter: Secondary | ICD-10-CM | POA: Diagnosis not present

## 2021-01-13 DIAGNOSIS — M25561 Pain in right knee: Secondary | ICD-10-CM | POA: Diagnosis not present

## 2021-01-13 DIAGNOSIS — M6281 Muscle weakness (generalized): Secondary | ICD-10-CM | POA: Diagnosis not present

## 2021-01-23 ENCOUNTER — Telehealth: Payer: Self-pay

## 2021-01-23 NOTE — Telephone Encounter (Signed)
Patient is calling in stating that he doesn't know where we have referred him to go for CPAP supplies, but hasnt heard from them in over 6-8 weeks and wanted to know their information so he can call them.

## 2021-01-24 NOTE — Telephone Encounter (Signed)
Per [place] message: [facility]!  We have some new team members handling our Epic orders.  I'm including them here.  Leroy Sea will pull your order and process.  Thank you!  Melissa   Pt notified via VM

## 2021-01-24 NOTE — Telephone Encounter (Signed)
Community message sent to [facility] in reference to his cpap supplies. Awaiting a reply.

## 2021-01-29 DIAGNOSIS — G4733 Obstructive sleep apnea (adult) (pediatric): Secondary | ICD-10-CM | POA: Diagnosis not present

## 2021-01-30 DIAGNOSIS — M1711 Unilateral primary osteoarthritis, right knee: Secondary | ICD-10-CM | POA: Diagnosis not present

## 2021-02-06 DIAGNOSIS — M1711 Unilateral primary osteoarthritis, right knee: Secondary | ICD-10-CM | POA: Diagnosis not present

## 2021-02-17 DIAGNOSIS — M1711 Unilateral primary osteoarthritis, right knee: Secondary | ICD-10-CM | POA: Diagnosis not present

## 2021-03-01 DIAGNOSIS — G4733 Obstructive sleep apnea (adult) (pediatric): Secondary | ICD-10-CM | POA: Diagnosis not present

## 2021-03-26 ENCOUNTER — Other Ambulatory Visit: Payer: Self-pay | Admitting: Family Medicine

## 2021-03-26 DIAGNOSIS — I10 Essential (primary) hypertension: Secondary | ICD-10-CM

## 2021-04-01 DIAGNOSIS — G4733 Obstructive sleep apnea (adult) (pediatric): Secondary | ICD-10-CM | POA: Diagnosis not present

## 2021-04-16 ENCOUNTER — Other Ambulatory Visit: Payer: Self-pay | Admitting: Family Medicine

## 2021-04-16 DIAGNOSIS — L2481 Irritant contact dermatitis due to metals: Secondary | ICD-10-CM

## 2021-04-16 DIAGNOSIS — L249 Irritant contact dermatitis, unspecified cause: Secondary | ICD-10-CM

## 2021-05-01 DIAGNOSIS — G4733 Obstructive sleep apnea (adult) (pediatric): Secondary | ICD-10-CM | POA: Diagnosis not present

## 2021-05-24 ENCOUNTER — Other Ambulatory Visit: Payer: Self-pay | Admitting: Family Medicine

## 2021-05-24 DIAGNOSIS — L2481 Irritant contact dermatitis due to metals: Secondary | ICD-10-CM

## 2021-05-24 DIAGNOSIS — L249 Irritant contact dermatitis, unspecified cause: Secondary | ICD-10-CM

## 2021-05-26 NOTE — Telephone Encounter (Signed)
Last filled on 12/09/20 #30 g, 0 refill LOV 11/18/20 OSA and referral

## 2021-06-09 ENCOUNTER — Other Ambulatory Visit: Payer: Self-pay | Admitting: Family Medicine

## 2021-06-09 DIAGNOSIS — E291 Testicular hypofunction: Secondary | ICD-10-CM

## 2021-06-11 NOTE — Telephone Encounter (Signed)
Christy, I do not prescribe testosterone so please send testosterone Rx's to other providers, thanks.

## 2021-06-11 NOTE — Telephone Encounter (Signed)
Latest T levels reviewed.  Will continue in PCP's absence

## 2021-08-05 DIAGNOSIS — M25571 Pain in right ankle and joints of right foot: Secondary | ICD-10-CM | POA: Diagnosis not present

## 2021-09-09 ENCOUNTER — Other Ambulatory Visit: Payer: Self-pay | Admitting: Primary Care

## 2021-09-10 ENCOUNTER — Other Ambulatory Visit: Payer: Self-pay | Admitting: Family Medicine

## 2021-09-10 DIAGNOSIS — E291 Testicular hypofunction: Secondary | ICD-10-CM

## 2021-09-10 DIAGNOSIS — I10 Essential (primary) hypertension: Secondary | ICD-10-CM

## 2021-09-10 DIAGNOSIS — E559 Vitamin D deficiency, unspecified: Secondary | ICD-10-CM

## 2021-09-10 DIAGNOSIS — E782 Mixed hyperlipidemia: Secondary | ICD-10-CM

## 2021-09-11 ENCOUNTER — Other Ambulatory Visit: Payer: BC Managed Care – PPO

## 2021-09-18 ENCOUNTER — Other Ambulatory Visit: Payer: Self-pay | Admitting: Family Medicine

## 2021-09-18 ENCOUNTER — Other Ambulatory Visit (INDEPENDENT_AMBULATORY_CARE_PROVIDER_SITE_OTHER): Payer: BC Managed Care – PPO

## 2021-09-18 ENCOUNTER — Other Ambulatory Visit: Payer: Self-pay

## 2021-09-18 DIAGNOSIS — E782 Mixed hyperlipidemia: Secondary | ICD-10-CM | POA: Diagnosis not present

## 2021-09-18 DIAGNOSIS — I10 Essential (primary) hypertension: Secondary | ICD-10-CM

## 2021-09-18 DIAGNOSIS — E559 Vitamin D deficiency, unspecified: Secondary | ICD-10-CM | POA: Diagnosis not present

## 2021-09-18 DIAGNOSIS — E291 Testicular hypofunction: Secondary | ICD-10-CM

## 2021-09-18 LAB — TESTOSTERONE: Testosterone: 636.85 ng/dL (ref 300.00–890.00)

## 2021-09-18 LAB — COMPREHENSIVE METABOLIC PANEL
ALT: 49 U/L (ref 0–53)
AST: 15 U/L (ref 0–37)
Albumin: 4.6 g/dL (ref 3.5–5.2)
Alkaline Phosphatase: 63 U/L (ref 39–117)
BUN: 14 mg/dL (ref 6–23)
CO2: 28 mEq/L (ref 19–32)
Calcium: 9.7 mg/dL (ref 8.4–10.5)
Chloride: 102 mEq/L (ref 96–112)
Creatinine, Ser: 0.88 mg/dL (ref 0.40–1.50)
GFR: 105.04 mL/min (ref >60.00)
Glucose, Bld: 91 mg/dL (ref 70–99)
Potassium: 4.2 mEq/L (ref 3.5–5.1)
Sodium: 137 mEq/L (ref 135–145)
Total Bilirubin: 0.5 mg/dL (ref 0.2–1.2)
Total Protein: 7.5 g/dL (ref 6.0–8.3)

## 2021-09-18 LAB — CBC
HCT: 42.2 % (ref 39.0–52.0)
Hemoglobin: 14.8 g/dL (ref 13.0–17.0)
MCHC: 35 g/dL (ref 30.0–36.0)
MCV: 91.8 fl (ref 78.0–100.0)
Platelets: 225 10*3/uL (ref 150.0–400.0)
RBC: 4.6 Mil/uL (ref 4.22–5.81)
RDW: 13.4 % (ref 11.5–15.5)
WBC: 6.1 10*3/uL (ref 4.0–10.5)

## 2021-09-18 LAB — LDL CHOLESTEROL, DIRECT: Direct LDL: 172 mg/dL

## 2021-09-18 LAB — LIPID PANEL
Cholesterol: 271 mg/dL — ABNORMAL HIGH (ref 0–200)
HDL: 45.1 mg/dL (ref >39.00)
NonHDL: 225.45
Total CHOL/HDL Ratio: 6
Triglycerides: 293 mg/dL — ABNORMAL HIGH (ref 0.0–149.0)
VLDL: 58.6 mg/dL — ABNORMAL HIGH (ref 0.0–40.0)

## 2021-09-18 LAB — VITAMIN D 25 HYDROXY (VIT D DEFICIENCY, FRACTURES): VITD: 16.32 ng/mL — ABNORMAL LOW (ref 30.00–100.00)

## 2021-09-18 MED ORDER — CHOLECALCIFEROL 1.25 MG (50000 UT) PO TABS: 1.0000 | ORAL_TABLET | ORAL | 1 refills | Status: DC

## 2021-09-22 ENCOUNTER — Other Ambulatory Visit: Payer: Self-pay

## 2021-09-22 ENCOUNTER — Ambulatory Visit (INDEPENDENT_AMBULATORY_CARE_PROVIDER_SITE_OTHER): Payer: BC Managed Care – PPO | Admitting: Family Medicine

## 2021-09-22 VITALS — BP 150/94 | HR 100 | Temp 97.8°F | Resp 12 | Ht 72.0 in | Wt 317.1 lb

## 2021-09-22 DIAGNOSIS — E291 Testicular hypofunction: Secondary | ICD-10-CM | POA: Diagnosis not present

## 2021-09-22 DIAGNOSIS — E782 Mixed hyperlipidemia: Secondary | ICD-10-CM | POA: Diagnosis not present

## 2021-09-22 DIAGNOSIS — I10 Essential (primary) hypertension: Secondary | ICD-10-CM | POA: Diagnosis not present

## 2021-09-22 DIAGNOSIS — Z0000 Encounter for general adult medical examination without abnormal findings: Secondary | ICD-10-CM | POA: Diagnosis not present

## 2021-09-22 MED ORDER — TESTOSTERONE CYPIONATE 200 MG/ML IM SOLN: 100.0000 mg | INTRAMUSCULAR | 0 refills | Status: DC

## 2021-09-22 NOTE — Patient Instructions (Addendum)
Blood pressure ?- take 2 of your blood pressure medications ?- mychart in 2-3 weeks with home readings ?- return in 3 months ? ?Alcohol ?- work to reduce to fewer than 14 per week ?- consider non-alcoholic wine ? ?Gagging ?- omeprazole for 2 weeks ?- update in mychart on your gagging ?

## 2021-09-22 NOTE — Assessment & Plan Note (Signed)
Elevated, patient will take 2 tablets of his lisinopril hydrochlorothiazide 20-12.5 mg.  Update via MyChart in a couple of weeks with home monitoring return in 3 months ?

## 2021-09-22 NOTE — Assessment & Plan Note (Signed)
ASCVD <7.5% but given elevated cholesterol, weight, hx of tobacco exposure and family hx discussed CT Ca score and pt is interested. Ordered today.  ?

## 2021-09-22 NOTE — Assessment & Plan Note (Signed)
Would like to switch to once weekly testosterone. Changed prescription 100 mg weekly. Recheck in 3 months ?

## 2021-09-22 NOTE — Progress Notes (Signed)
Annual Exam   Chief Complaint:  Chief Complaint  Patient presents with   Annual Exam    History of Present Illness:  Carl Roberts is a 46 y.o. presents today for annual examination.     Nutrition/Lifestyle Diet: not terrible, drinking is the biggest impact 2-3 per night Exercise: not currently He is single partner, contraception - none.  Any issues getting or maintaining an erection? no  Social History   Tobacco Use  Smoking Status Former   Packs/day: 0.50   Years: 4.00   Pack years: 2.00   Types: Cigarettes, Pipe   Quit date: 08/03/1998   Years since quitting: 23.1  Smokeless Tobacco Never   Social History   Substance and Sexual Activity  Alcohol Use Yes   Comment: 1 glass of wine, liquor on the weekends   Social History   Substance and Sexual Activity  Drug Use No     Safety The patient wears seatbelts: yes.     The patient feels safe at home and in their relationships: yes.  General Health Dentist in the last year: No Eye doctor: yes  Weight Wt Readings from Last 3 Encounters:  09/22/21 (!) 317 lb 2 oz (143.8 kg)  11/18/20 (!) 313 lb 12 oz (142.3 kg)  05/06/20 (!) 309 lb 12 oz (140.5 kg)   Patient has very high BMI  BMI Readings from Last 1 Encounters:  09/22/21 43.01 kg/m     Chronic disease screening Blood pressure monitoring:  BP Readings from Last 3 Encounters:  09/22/21 (!) 150/94  11/18/20 140/90  05/06/20 (!) 130/100    Lipid Monitoring: Indication for screening: age >35, obesity, diabetes, family hx, CV risk factors.  Lipid screening: Yes  Lab Results  Component Value Date   CHOL 271 (H) 09/18/2021   HDL 45.10 09/18/2021   LDLCALC 174 (H) 12/04/2019   LDLDIRECT 172.0 09/18/2021   TRIG 293.0 (H) 09/18/2021   CHOLHDL 6 09/18/2021   The 10-year ASCVD risk score (Arnett DK, et al., 2019) is: 4.9%   Values used to calculate the score:     Age: 23 years     Sex: Male     Is Non-Hispanic African American: No     Diabetic:  No     Tobacco smoker: No     Systolic Blood Pressure: 643 mmHg     Is BP treated: Yes     HDL Cholesterol: 45.1 mg/dL     Total Cholesterol: 271 mg/dL   Diabetes Screening: age >60, overweight, family hx, PCOS, hx of gestational diabetes, at risk ethnicity, elevated blood pressure >135/80.  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 Authorizing Provider  Cholecalciferol 1.25 MG (50000 UT) TABS Take 1 tablet by mouth once a week. 09/18/21  Yes Lesleigh Noe, MD  lisinopril-hydrochlorothiazide (ZESTORETIC) 20-12.5  MG tablet Take 1 tablet by mouth daily. 11/18/20  Yes Lesleigh Noe, MD  NEEDLE, DISP, 25 G (BD HYPODERMIC NEEDLE) 25G X 1-1/2" MISC USE 1 EACH EVERY 14 (FOURTEEN) DAYS (FOR ADMINISTERING THE MEDICATION) 11/30/19  Yes Lesleigh Noe, MD  testosterone cypionate (DEPOTESTOSTERONE CYPIONATE) 200 MG/ML injection INJECT 1 ML (200 MG TOTAL) INTO THE MUSCLE EVERY 14 (FOURTEEN) DAYS. 06/11/21  Yes Ria Bush, MD  triamcinolone cream (KENALOG) 0.1 % APPLY TO AFFECTED AREA TWICE A DAY 05/26/21  Yes Lesleigh Noe, MD    Allergies  Allergen Reactions   Naproxen Shortness Of Breath   Shellfish Allergy Other (See Comments)     Social History   Socioeconomic History   Marital status: Married    Spouse name: Megan   Number of children: 2    Years of education: high school, some college   Highest education level: Not on file  Occupational History   Not on file  Tobacco Use   Smoking status: Former    Packs/day: 0.50    Years: 4.00    Pack years: 2.00    Types: Cigarettes, Pipe    Quit date: 08/03/1998    Years since quitting: 23.1   Smokeless tobacco: Never  Vaping Use   Vaping Use: Never used  Substance and Sexual Activity   Alcohol use: Yes    Comment: 1 glass of wine, liquor on the weekends   Drug use: No   Sexual activity: Yes    Birth control/protection: None  Other Topics Concern   Not on file  Social History Narrative   Lives with wife, [name]   2 children   Exercise: tries to get 3 days a week, was running 2 times a week   Social Determinants of Radio broadcast assistant Strain: Not on file  Food Insecurity: Not on file  Transportation Needs: Not on file  Physical Activity: Not on file  Stress: Not on file  Social Connections: Not on file  Intimate Partner Violence: Not on file    Family History  Problem Relation Age of Onset   Arthritis Mother    Diabetes Mother    Arthritis Father    Glaucoma Father    Asthma Maternal Grandmother    Lung cancer Maternal Grandmother    COPD Maternal Grandfather    Heart disease Maternal Grandfather    Hyperlipidemia Maternal Grandfather    Heart attack Maternal Grandfather 55   Alzheimer's disease Paternal Grandmother    Parkinson's disease Paternal Grandfather    Heart attack Paternal Grandfather 70   Allergies Son    Hypertension Brother    Hyperlipidemia Brother     Review of Systems  Constitutional:  Negative for chills and fever.  HENT:  Negative for congestion and sore throat.   Eyes:  Negative for blurred vision and double vision.  Respiratory:  Negative for shortness of breath.   Cardiovascular:  Negative for chest pain.  Gastrointestinal:  Negative for heartburn, nausea and vomiting.  Genitourinary: Negative.   Musculoskeletal:  Negative.  Negative for myalgias.  Skin:  Negative for rash.  Neurological:  Negative for dizziness and headaches.  Endo/Heme/Allergies:  Does not bruise/bleed easily.  Psychiatric/Behavioral:  Negative for depression. The patient is not nervous/anxious.     Physical Exam BP (!) 150/94    Pulse 100    Temp 97.8 F (36.6 C)    Resp 12    Ht 6' (1.829 m)    Wt (!) 317 lb 2  oz (143.8 kg)    SpO2 97%    BMI 43.01 kg/m    BP Readings from Last 3 Encounters:  09/22/21 (!) 150/94  11/18/20 140/90  05/06/20 (!) 130/100      Physical Exam Constitutional:      General: He is not in acute distress.    Appearance: He is well-developed. He is obese. He is not diaphoretic.  HENT:     Head: Normocephalic and atraumatic.     Right Ear: Tympanic membrane and ear canal normal.     Left Ear: Tympanic membrane and ear canal normal.     Nose: Nose normal.     Mouth/Throat:     Pharynx: Uvula midline.  Eyes:     General: No scleral icterus.    Conjunctiva/sclera: Conjunctivae normal.     Pupils: Pupils are equal, round, and reactive to light.  Cardiovascular:     Rate and Rhythm: Normal rate and regular rhythm.     Heart sounds: Normal heart sounds. No murmur heard. Pulmonary:     Effort: Pulmonary effort is normal. No respiratory distress.     Breath sounds: Normal breath sounds. No wheezing.  Abdominal:     General: Bowel sounds are normal. There is no distension.     Palpations: Abdomen is soft. There is no mass.     Tenderness: There is no abdominal tenderness. There is no guarding.  Musculoskeletal:        General: Normal range of motion.     Cervical back: Normal range of motion and neck supple.  Lymphadenopathy:     Cervical: No cervical adenopathy.  Skin:    General: Skin is warm and dry.     Capillary Refill: Capillary refill takes less than 2 seconds.  Neurological:     Mental Status: He is alert and oriented to person, place, and time.       Results:  PHQ-9:   [facility] Office Visit from 08/03/2018 in [place] at [place]  PHQ-9 Total Score 0         Assessment: 46 y.o. here for routine annual physical examination.  Plan: Problem List Items Addressed This Visit       Cardiovascular and Mediastinum   Essential hypertension    Elevated, patient will take 2 tablets of his lisinopril hydrochlorothiazide 20-12.5 mg.  Update via MyChart in a couple of weeks with home monitoring return in 3 months        Endocrine   Hypogonadism in male    Would like to switch to once weekly testosterone. Changed prescription 100 mg weekly. Recheck in 3 months      Relevant Medications   testosterone cypionate (DEPOTESTOSTERONE CYPIONATE) 200 MG/ML injection     Other   Hyperlipidemia, mixed    ASCVD <7.5% but given elevated cholesterol, weight, hx of tobacco exposure and family hx discussed CT Ca score and pt is interested. Ordered today.       Relevant Orders   CT CARDIAC SCORING (SELF PAY ONLY)   Other Visit Diagnoses     Annual physical exam    -  Primary       Screening: -- Blood pressure screen elevated: continued to monitor. -- cholesterol screening: not due for screening -- Weight screening: obese: discussed management options, including lifestyle, dietary, and exercise. -- Diabetes Screening:  negative -- Nutrition: Encouraged healthy diet and exercise  The 10-year ASCVD risk score (Arnett DK, et al., 2019) is: 4.9%   Values used to  calculate the score:     Age: 44 years     Sex: Male     Is Non-Hispanic African American: No     Diabetic: No     Tobacco smoker: No     Systolic Blood Pressure: 465 mmHg     Is BP treated: Yes     HDL Cholesterol: 45.1 mg/dL     Total Cholesterol: 271 mg/dL  -- Statin therapy for Age 68-75 with CVD risk >7.5%  Psych -- Depression screening (PHQ-9):  [facility] Visit from 08/03/2018 in [facility] [facility] at [place]  PHQ-9 Total Score 0        Safety --  tobacco screening: not using -- alcohol screening:  discussed cutting back -- no evidence of domestic violence or intimate partner violence.   Cancer Screening -- No age related cancer screening due  Immunizations Immunization History  Administered Date(s) Administered   Influenza,inj,Quad PF,6+ Mos 08/03/2018   PFIZER(Purple Top)SARS-COV-2 Vaccination 03/14/2020, 04/04/2020   Pneumococcal Polysaccharide-23 12/25/2015   Tdap 04/17/2011    -- flu vaccine not up to date -- TDAP q10 years up to date -- PPSV-23 (19-64 with chronic disease or smoking) up to date -- Covid-19 Vaccine up to date   Encouraged regular vision and dental screening. Encouraged healthy exercise and diet.   Lesleigh Noe

## 2021-09-30 ENCOUNTER — Ambulatory Visit (INDEPENDENT_AMBULATORY_CARE_PROVIDER_SITE_OTHER): Payer: BC Managed Care – PPO

## 2021-09-30 ENCOUNTER — Other Ambulatory Visit: Payer: Self-pay

## 2021-09-30 DIAGNOSIS — Z23 Encounter for immunization: Secondary | ICD-10-CM | POA: Diagnosis not present

## 2021-09-30 NOTE — Progress Notes (Signed)
Per orders of Dr. Diona Browner, in Dr. Verda Cumins absence, injection of TDAP given by Loreen Freud. ?Patient tolerated injection well.  ?

## 2021-10-02 DIAGNOSIS — M1711 Unilateral primary osteoarthritis, right knee: Secondary | ICD-10-CM | POA: Diagnosis not present

## 2021-10-30 DIAGNOSIS — I10 Essential (primary) hypertension: Secondary | ICD-10-CM | POA: Diagnosis not present

## 2021-10-30 DIAGNOSIS — J0140 Acute pansinusitis, unspecified: Secondary | ICD-10-CM | POA: Diagnosis not present

## 2021-10-30 DIAGNOSIS — J209 Acute bronchitis, unspecified: Secondary | ICD-10-CM | POA: Diagnosis not present

## 2021-11-05 ENCOUNTER — Ambulatory Visit
Admission: RE | Admit: 2021-11-05 | Discharge: 2021-11-05 | Disposition: A | Discharge status: Home / Self Care | Payer: Self-pay | Source: Ambulatory Visit | Attending: Family Medicine | Admitting: Family Medicine

## 2021-11-05 DIAGNOSIS — E782 Mixed hyperlipidemia: Secondary | ICD-10-CM

## 2021-11-06 ENCOUNTER — Encounter: Payer: Self-pay | Admitting: Family Medicine

## 2021-11-06 DIAGNOSIS — K760 Fatty (change of) liver, not elsewhere classified: Secondary | ICD-10-CM | POA: Insufficient documentation

## 2021-11-07 ENCOUNTER — Encounter: Payer: Self-pay | Admitting: Family Medicine

## 2021-11-07 DIAGNOSIS — E782 Mixed hyperlipidemia: Secondary | ICD-10-CM

## 2021-11-07 MED ORDER — ATORVASTATIN CALCIUM 10 MG PO TABS: 10.0000 mg | ORAL_TABLET | Freq: Every day | ORAL | 3 refills | Status: DC

## 2021-11-10 ENCOUNTER — Other Ambulatory Visit: Payer: Self-pay | Admitting: Family Medicine

## 2021-11-18 ENCOUNTER — Ambulatory Visit: Payer: BC Managed Care – PPO | Admitting: Family Medicine

## 2021-11-18 ENCOUNTER — Encounter: Payer: Self-pay | Admitting: Family Medicine

## 2021-11-18 VITALS — BP 140/90 | HR 86 | Temp 98.4°F | Ht 72.0 in | Wt 328.2 lb

## 2021-11-18 DIAGNOSIS — R109 Unspecified abdominal pain: Secondary | ICD-10-CM | POA: Diagnosis not present

## 2021-11-18 DIAGNOSIS — E291 Testicular hypofunction: Secondary | ICD-10-CM | POA: Diagnosis not present

## 2021-11-18 DIAGNOSIS — E559 Vitamin D deficiency, unspecified: Secondary | ICD-10-CM

## 2021-11-18 LAB — COMPREHENSIVE METABOLIC PANEL
ALT: 39 U/L (ref 0–53)
AST: 11 U/L (ref 0–37)
Albumin: 4.5 g/dL (ref 3.5–5.2)
Alkaline Phosphatase: 58 U/L (ref 39–117)
BUN: 13 mg/dL (ref 6–23)
CO2: 28 mEq/L (ref 19–32)
Calcium: 9.9 mg/dL (ref 8.4–10.5)
Chloride: 100 mEq/L (ref 96–112)
Creatinine, Ser: 1.21 mg/dL (ref 0.40–1.50)
GFR: 73.05 mL/min (ref >60.00)
Glucose, Bld: 102 mg/dL — ABNORMAL HIGH (ref 70–99)
Potassium: 4.1 mEq/L (ref 3.5–5.1)
Sodium: 136 mEq/L (ref 135–145)
Total Bilirubin: 0.5 mg/dL (ref 0.2–1.2)
Total Protein: 7.2 g/dL (ref 6.0–8.3)

## 2021-11-18 LAB — POC URINALSYSI DIPSTICK (AUTOMATED)
Bilirubin, UA: NEGATIVE
Blood, UA: NEGATIVE
Glucose, UA: NEGATIVE
Ketones, UA: NEGATIVE
Nitrite, UA: NEGATIVE
Protein, UA: NEGATIVE
Spec Grav, UA: 1.015 (ref 1.010–1.025)
Urobilinogen, UA: 0.2 E.U./dL
pH, UA: 7 (ref 5.0–8.0)

## 2021-11-18 LAB — CBC WITH DIFFERENTIAL/PLATELET
Basophils Absolute: 0.1 10*3/uL (ref 0.0–0.1)
Basophils Relative: 0.8 % (ref 0.0–3.0)
Eosinophils Absolute: 0.2 10*3/uL (ref 0.0–0.7)
Eosinophils Relative: 2.3 % (ref 0.0–5.0)
HCT: 41.2 % (ref 39.0–52.0)
Hemoglobin: 14.1 g/dL (ref 13.0–17.0)
Lymphocytes Relative: 23.6 % (ref 12.0–46.0)
Lymphs Abs: 1.8 10*3/uL (ref 0.7–4.0)
MCHC: 34.2 g/dL (ref 30.0–36.0)
MCV: 93.3 fl (ref 78.0–100.0)
Monocytes Absolute: 0.6 10*3/uL (ref 0.1–1.0)
Monocytes Relative: 8.2 % (ref 3.0–12.0)
Neutro Abs: 5.1 10*3/uL (ref 1.4–7.7)
Neutrophils Relative %: 65.1 % (ref 43.0–77.0)
Platelets: 257 10*3/uL (ref 150.0–400.0)
RBC: 4.42 Mil/uL (ref 4.22–5.81)
RDW: 13.7 % (ref 11.5–15.5)
WBC: 7.8 10*3/uL (ref 4.0–10.5)

## 2021-11-18 LAB — LIPASE: Lipase: 32 U/L (ref 11.0–59.0)

## 2021-11-18 LAB — VITAMIN D 25 HYDROXY (VIT D DEFICIENCY, FRACTURES): VITD: 22.6 ng/mL — ABNORMAL LOW (ref 30.00–100.00)

## 2021-11-18 LAB — TESTOSTERONE: Testosterone: 540.99 ng/dL (ref 300.00–890.00)

## 2021-11-18 NOTE — Assessment & Plan Note (Signed)
Acute,  ?  Kidney stone vs muscle strain vs other. No sign of splenomegaly. ?Eval with labs : cbc, CMET,  Lipase. ?

## 2021-11-18 NOTE — Assessment & Plan Note (Signed)
Due for re-eval on supplementation. ?

## 2021-11-18 NOTE — Progress Notes (Signed)
? ? Patient ID: Carl Roberts, male    DOB: December 31, 1975, 46 y.o.   MRN: [id number] ? ?This visit was conducted in person. ? ?BP 140/90   Pulse 86   Temp 98.4 ?F (36.9 ?C) (Oral)   Ht 6' (1.829 m)   Wt (!) 328 lb 4 oz (148.9 kg)   SpO2 96%   BMI 44.52 kg/m?   ? ?CC:  ?Chief Complaint  ?Patient presents with  ? Flank Pain  ?  Left ?  ? ? ?Subjective:  ? ?HPI: ?Carl Roberts is a 46 y.o. male  patient of Dr. Einar Pheasant  with history of HTN, fatty liver and morbid obesity presenting on 11/18/2021 for Flank Pain (Left/) ? ?Date of onset: 2 week. ? Cramping off and on, LUQ.Marland Kitchen lasts  few minutes. 6-8/10 on pain scale. ? Associate with cold sweats. ? No relation to eating. ? No changed with movement. ? Does sit for 1-2 our drive. ? No diarrhea, no constipation, no dysuria, no fever, no blood in urine. ? Recently did try a new energy drink.. ut it did not resolve with stopping this. ? No rash. ? ? Does not drink enough water. ? ? ?   ? ?Relevant past medical, surgical, family and social history reviewed and updated as indicated. Interim medical history since our last visit reviewed. ?Allergies and medications reviewed and updated. ?Outpatient Medications Prior to Visit  ?Medication Sig Dispense Refill  ? atorvastatin (LIPITOR) 10 MG tablet Take 1 tablet (10 mg total) by mouth daily. 90 tablet 3  ? lisinopril-hydrochlorothiazide (ZESTORETIC) 20-12.5 MG tablet Take 1 tablet by mouth daily. 90 tablet 3  ? NEEDLE, DISP, 25 G (BD HYPODERMIC NEEDLE) 25G X 1-1/2" MISC USE 1 EACH EVERY 14 (FOURTEEN) DAYS (FOR ADMINISTERING THE MEDICATION) 50 each 3  ? testosterone cypionate (DEPOTESTOSTERONE CYPIONATE) 200 MG/ML injection Inject 0.5 mLs (100 mg total) into the muscle once a week. 6 mL 0  ? triamcinolone cream (KENALOG) 0.1 % APPLY TO AFFECTED AREA TWICE A DAY 30 g 0  ? Cholecalciferol 1.25 MG (50000 UT) TABS Take 1 tablet by mouth once a week. 4 tablet 1  ? ?No facility-administered medications prior to visit.  ?  ? ?Per HPI unless  specifically indicated in ROS section below ?Review of Systems  ?Constitutional:  Negative for fatigue and fever.  ?HENT:  Negative for ear pain.   ?Eyes:  Negative for pain.  ?Respiratory:  Negative for cough and shortness of breath.   ?Cardiovascular:  Negative for chest pain, palpitations and leg swelling.  ?Gastrointestinal:  Negative for abdominal pain.  ?Genitourinary:  Negative for dysuria.  ?Musculoskeletal:  Negative for arthralgias.  ?Neurological:  Negative for syncope, light-headedness and headaches.  ?Psychiatric/Behavioral:  Negative for dysphoric mood.   ?Objective:  ?BP 140/90   Pulse 86   Temp 98.4 ?F (36.9 ?C) (Oral)   Ht 6' (1.829 m)   Wt (!) 328 lb 4 oz (148.9 kg)   SpO2 96%   BMI 44.52 kg/m?   ?Wt Readings from Last 3 Encounters:  ?11/18/21 (!) 328 lb 4 oz (148.9 kg)  ?09/22/21 (!) 317 lb 2 oz (143.8 kg)  ?11/18/20 (!) 313 lb 12 oz (142.3 kg)  ?  ?  ?Physical Exam ?Constitutional:   ?   Appearance: He is well-developed.  ?HENT:  ?   Head: Normocephalic.  ?   Right Ear: Hearing normal.  ?   Left Ear: Hearing normal.  ?   Nose: Nose normal.  ?  Neck:  ?   Thyroid: No thyroid mass or thyromegaly.  ?   Vascular: No carotid bruit.  ?   Trachea: Trachea normal.  ?Cardiovascular:  ?   Rate and Rhythm: Normal rate and regular rhythm.  ?   Pulses: Normal pulses.  ?   Heart sounds: Heart sounds not distant. No murmur heard. ?  No friction rub. No gallop.  ?   Comments: No peripheral edema ?Pulmonary:  ?   Effort: Pulmonary effort is normal. No respiratory distress.  ?   Breath sounds: Normal breath sounds.  ?Abdominal:  ?   General: Abdomen is protuberant.  ?   Palpations: Abdomen is soft.  ?   Tenderness: There is abdominal tenderness in the left upper quadrant.  ? ? ?Skin: ?   General: Skin is warm and dry.  ?   Findings: No rash.  ?Psychiatric:     ?   Speech: Speech normal.     ?   Behavior: Behavior normal.     ?   Thought Content: Thought content normal.  ? ?   ?Results for orders placed or  performed in visit on 11/18/21  ?POCT Urinalysis Dipstick (Automated)  ?Result Value Ref Range  ? Color, UA Yellow   ? Clarity, UA Clear   ? Glucose, UA Negative Negative  ? Bilirubin, UA Negative   ? Ketones, UA Negative   ? Spec Grav, UA 1.015 1.010 - 1.025  ? Blood, UA Negative   ? pH, UA 7.0 5.0 - 8.0  ? Protein, UA Negative Negative  ? Urobilinogen, UA 0.2 0.2 or 1.0 E.U./dL  ? Nitrite, UA Negative   ? Leukocytes, UA Trace (A) Negative  ? ? ?This visit occurred during the SARS-CoV-2 public health emergency.  Safety protocols were in place, including screening questions prior to the visit, additional usage of staff PPE, and extensive cleaning of exam room while observing appropriate contact time as indicated for disinfecting solutions.  ? ?COVID 19 screen:  No recent travel or known exposure to [place] ?The patient denies respiratory symptoms of COVID 19 at this time. ?The importance of social distancing was discussed today.  ? ?Assessment and Plan ? ?  ?Problem List Items Addressed This Visit   ? ? Hypogonadism in male  ?  Due for re-eval on supplementation. ? ?  ?  ? Relevant Orders  ? Testosterone  ? Left flank pain - Primary  ?  Acute,  ?  Kidney stone vs muscle strain vs other. No sign of splenomegaly. ?Eval with labs : cbc, CMET,  Lipase. ? ?  ?  ? Relevant Orders  ? POCT Urinalysis Dipstick (Automated) (Completed)  ? CBC with Differential/Platelet  ? Comprehensive metabolic panel  ? Lipase  ? Vitamin D deficiency  ?  Due for re-eval 1 month after supplementation. ? ?  ?  ? Relevant Orders  ? VITAMIN D 25 Hydroxy (Vit-D Deficiency, Fractures)  ? ? ? ?Eliezer Lofts, MD  ? ? ?

## 2021-11-18 NOTE — Assessment & Plan Note (Signed)
Due for re-eval 1 month after supplementation. ?

## 2021-11-18 NOTE — Patient Instructions (Addendum)
Increase water intake. ? Please stop at the lab to have labs drawn. ?Start left chest wall stretching, heat, massage. Can use tyleneol for pain as needed. ? ? ? ? ?

## 2021-12-09 ENCOUNTER — Encounter: Payer: Self-pay | Admitting: Family Medicine

## 2021-12-09 ENCOUNTER — Ambulatory Visit: Payer: BC Managed Care – PPO | Admitting: Family Medicine

## 2021-12-09 VITALS — BP 128/82 | HR 91 | Temp 96.9°F | Ht 73.0 in | Wt 323.4 lb

## 2021-12-09 DIAGNOSIS — J0100 Acute maxillary sinusitis, unspecified: Secondary | ICD-10-CM

## 2021-12-09 DIAGNOSIS — H66002 Acute suppurative otitis media without spontaneous rupture of ear drum, left ear: Secondary | ICD-10-CM

## 2021-12-09 MED ORDER — AMOXICILLIN-POT CLAVULANATE 875-125 MG PO TABS: 1.0000 | ORAL_TABLET | Freq: Two times a day (BID) | ORAL | 0 refills | Status: AC

## 2021-12-09 NOTE — Patient Instructions (Signed)
Sinus infection Continue over the counter treatment  Take antibiotic

## 2021-12-09 NOTE — Progress Notes (Signed)
Subjective:     Carl Roberts is a 46 y.o. male presenting for Nasal Congestion (X 4-5 days), Cough (X 4-5 days ), and Facial Pain     Cough Associated symptoms include headaches. Pertinent negatives include no chest pain, chills, ear pain, myalgias, sore throat or shortness of breath.  Sinusitis This is a new problem. The current episode started in the past 7 days. The problem has been gradually improving since onset. There has been no fever. Associated symptoms include congestion, coughing (productive), headaches and sinus pressure. Pertinent negatives include no chills, ear pain, shortness of breath or sore throat. Treatments tried: sinus medication, tylenol severe, mucinex, saline rinse.    Review of Systems  Constitutional:  Negative for chills.  HENT:  Positive for congestion, dental problem and sinus pressure. Negative for ear pain and sore throat.   Respiratory:  Positive for cough (productive). Negative for shortness of breath.   Cardiovascular:  Negative for chest pain.  Gastrointestinal:  Negative for diarrhea, nausea and vomiting.  Musculoskeletal:  Negative for arthralgias and myalgias.  Neurological:  Positive for headaches.    Social History   Tobacco Use  Smoking Status Former   Packs/day: 0.50   Years: 4.00   Pack years: 2.00   Types: Cigarettes, Pipe   Quit date: 08/03/1998   Years since quitting: 23.3  Smokeless Tobacco Never        Objective:    BP Readings from Last 3 Encounters:  12/09/21 128/82  11/18/21 140/90  09/22/21 (!) 150/94   Wt Readings from Last 3 Encounters:  12/09/21 (!) 323 lb 6 oz (146.7 kg)  11/18/21 (!) 328 lb 4 oz (148.9 kg)  09/22/21 (!) 317 lb 2 oz (143.8 kg)    BP 128/82   Pulse 91   Temp (!) 96.9 F (36.1 C) (Temporal)   Ht '6\' 1"'$  (1.854 m)   Wt (!) 323 lb 6 oz (146.7 kg)   SpO2 97%   BMI 42.66 kg/m    Physical Exam Constitutional:      General: He is not in acute distress.    Appearance: He is  well-developed. He is not ill-appearing.  HENT:     Head: Normocephalic and atraumatic.     Right Ear: Tympanic membrane and ear canal normal.     Left Ear: Ear canal normal. A middle ear effusion is present. Tympanic membrane is erythematous and bulging.     Nose: Mucosal edema and rhinorrhea present.     Right Sinus: No maxillary sinus tenderness or frontal sinus tenderness.     Left Sinus: No maxillary sinus tenderness or frontal sinus tenderness.     Mouth/Throat:     Pharynx: Uvula midline. Posterior oropharyngeal erythema present. No oropharyngeal exudate.     Tonsils: 0 on the right. 0 on the left.  Cardiovascular:     Rate and Rhythm: Normal rate and regular rhythm.     Heart sounds: No murmur heard. Pulmonary:     Effort: Pulmonary effort is normal. No respiratory distress.     Breath sounds: Normal breath sounds.  Musculoskeletal:     Cervical back: Neck supple.  Lymphadenopathy:     Cervical: No cervical adenopathy.  Skin:    General: Skin is warm and dry.     Capillary Refill: Capillary refill takes less than 2 seconds.  Neurological:     Mental Status: He is alert.          Assessment & Plan:   Problem List  Items Addressed This Visit   None Visit Diagnoses     Acute non-recurrent maxillary sinusitis    -  Primary   Relevant Medications   amoxicillin-clavulanate (AUGMENTIN) 875-125 MG tablet   Non-recurrent acute suppurative otitis media of left ear without spontaneous rupture of tympanic membrane       Relevant Medications   amoxicillin-clavulanate (AUGMENTIN) 875-125 MG tablet      Ear infection and sinus infection Take abx  Continue mucinex   Return if symptoms worsen or fail to improve.  Lesleigh Noe, MD

## 2021-12-18 ENCOUNTER — Other Ambulatory Visit: Payer: Self-pay | Admitting: Family Medicine

## 2021-12-18 DIAGNOSIS — I10 Essential (primary) hypertension: Secondary | ICD-10-CM

## 2021-12-25 ENCOUNTER — Ambulatory Visit: Payer: BC Managed Care – PPO | Admitting: Family Medicine

## 2021-12-25 VITALS — BP 140/100 | HR 86 | Temp 97.4°F | Wt 322.4 lb

## 2021-12-25 DIAGNOSIS — H6692 Otitis media, unspecified, left ear: Secondary | ICD-10-CM | POA: Diagnosis not present

## 2021-12-25 DIAGNOSIS — I10 Essential (primary) hypertension: Secondary | ICD-10-CM | POA: Diagnosis not present

## 2021-12-25 DIAGNOSIS — H6591 Unspecified nonsuppurative otitis media, right ear: Secondary | ICD-10-CM | POA: Insufficient documentation

## 2021-12-25 MED ORDER — CEFPODOXIME PROXETIL 200 MG PO TABS: 200.0000 mg | ORAL_TABLET | Freq: Two times a day (BID) | ORAL | 0 refills | Status: AC

## 2021-12-25 MED ORDER — PREDNISONE 20 MG PO TABS: 20.0000 mg | ORAL_TABLET | Freq: Every day | ORAL | 0 refills | Status: AC

## 2021-12-25 NOTE — Assessment & Plan Note (Signed)
Left ear drum with resolving infection. Discussed that it could be non-infectious as improvement. Recommended pseudoephedrine for a few days. If no improvement will try another round of abx. Prednisone prescribed - he will consider taking if severe pain. Update if no improvement on abx or if worsening symptoms - will plan for ENT referral. Vs Ceftriaxone x 3 days

## 2021-12-25 NOTE — Patient Instructions (Signed)
Otitis media - try pseudoephedrine (Over the counter) for 2 days - if no improvement - can either try Antibiotic sent to pharmacy or steroid sent to pharmacy - call if no improvement and will place ENT referral  Return if new fevers, chills worsening symptoms

## 2021-12-25 NOTE — Assessment & Plan Note (Signed)
Did not take his bp medication today. Cont lisinopril-HCTZ 40-25 mg.

## 2021-12-25 NOTE — Progress Notes (Signed)
Subjective:     Carl Roberts is a 46 y.o. male presenting for Ear Pain (L )     HPI  #Left ear pain - completed 10 day course of medication - no new fevers - sinus symptoms are improved - still with productive cough - taking mucinex - ear pain was improving on the antibiotics but start getting worse again - painful to swallow in the ear and left side of the face  Has not taking lisinopril  Review of Systems   Social History   Tobacco Use  Smoking Status Former   Packs/day: 0.50   Years: 4.00   Total pack years: 2.00   Types: Cigarettes, Pipe   Quit date: 08/03/1998   Years since quitting: 23.4  Smokeless Tobacco Never        Objective:    BP Readings from Last 3 Encounters:  12/25/21 (!) 140/100  12/09/21 128/82  11/18/21 140/90   Wt Readings from Last 3 Encounters:  12/25/21 (!) 322 lb 6 oz (146.2 kg)  12/09/21 (!) 323 lb 6 oz (146.7 kg)  11/18/21 (!) 328 lb 4 oz (148.9 kg)    BP (!) 140/100   Pulse 86   Temp (!) 97.4 F (36.3 C) (Temporal)   Wt (!) 322 lb 6 oz (146.2 kg)   SpO2 97%   BMI 42.53 kg/m    Physical Exam Constitutional:      General: He is not in acute distress.    Appearance: He is well-developed. He is not ill-appearing.  HENT:     Head: Normocephalic and atraumatic.     Right Ear: Tympanic membrane and ear canal normal.     Left Ear: Ear canal normal. A middle ear effusion is present. Tympanic membrane is not erythematous, retracted or bulging.     Nose: Mucosal edema and rhinorrhea present.     Right Sinus: No maxillary sinus tenderness or frontal sinus tenderness.     Left Sinus: No maxillary sinus tenderness or frontal sinus tenderness.     Mouth/Throat:     Pharynx: Uvula midline. Posterior oropharyngeal erythema present. No oropharyngeal exudate.     Tonsils: 0 on the right. 0 on the left.  Cardiovascular:     Rate and Rhythm: Normal rate and regular rhythm.     Heart sounds: No murmur heard. Pulmonary:      Effort: Pulmonary effort is normal. No respiratory distress.     Breath sounds: Normal breath sounds.  Musculoskeletal:     Cervical back: Neck supple.  Lymphadenopathy:     Cervical: No cervical adenopathy.  Skin:    General: Skin is warm and dry.     Capillary Refill: Capillary refill takes less than 2 seconds.  Neurological:     Mental Status: He is alert.           Assessment & Plan:   Problem List Items Addressed This Visit       Cardiovascular and Mediastinum   Essential hypertension    Did not take his bp medication today. Cont lisinopril-HCTZ 40-25 mg.         Nervous and Auditory   OM (otitis media), recurrent, left - Primary    Left ear drum with resolving infection. Discussed that it could be non-infectious as improvement. Recommended pseudoephedrine for a few days. If no improvement will try another round of abx. Prednisone prescribed - he will consider taking if severe pain. Update if no improvement on abx or if worsening symptoms -  will plan for ENT referral. Vs Ceftriaxone x 3 days      Relevant Medications   cefpodoxime (VANTIN) 200 MG tablet   predniSONE (DELTASONE) 20 MG tablet     Return if symptoms worsen or fail to improve.  Lesleigh Noe, MD

## 2022-02-26 ENCOUNTER — Other Ambulatory Visit: Payer: Self-pay | Admitting: Family Medicine

## 2022-02-26 DIAGNOSIS — E291 Testicular hypofunction: Secondary | ICD-10-CM

## 2022-02-26 DIAGNOSIS — L249 Irritant contact dermatitis, unspecified cause: Secondary | ICD-10-CM

## 2022-02-26 DIAGNOSIS — L2481 Irritant contact dermatitis due to metals: Secondary | ICD-10-CM

## 2022-03-18 ENCOUNTER — Telehealth: Payer: Self-pay | Admitting: Family Medicine

## 2022-03-18 NOTE — Telephone Encounter (Signed)
Noted will see tomorrow 

## 2022-03-18 NOTE — Telephone Encounter (Signed)
Spoke to patient by telephone and was advised that he started with cold like symptoms Sunday. Patient stated that he has not tested for covid. Patient stated that he get a covid test and do one. Patient stated that he will call back with results.

## 2022-03-18 NOTE — Telephone Encounter (Signed)
Patient scheduled for an office visit with Dr. Einar Pheasant 03/19/22 at 9:00 am.

## 2022-03-18 NOTE — Telephone Encounter (Signed)
Patient called In stating that he has been experiencing dizzy spells since yesterday 8/29. He stated that he is having some sinus issues ,so he's not sure if its related to that. I scheduled him an appointment for 8/31 at 9. I sent hm to triage as well due to the dizziness that he's experiencing.

## 2022-03-18 NOTE — Telephone Encounter (Signed)
PLEASE NOTE: All timestamps contained within this report are represented as [place] Standard Time. CONFIDENTIALTY NOTICE: This fax transmission is intended only for the addressee. It contains information that is legally privileged, confidential or otherwise protected from use or disclosure. If you are not the intended recipient, you are strictly prohibited from reviewing, disclosing, copying using or disseminating any of this information or taking any action in reliance on or regarding this information. If you have received this fax in error, please notify us immediately by telephone so that we can arrange for its return to [place]. Phone: [phone], Toll-Free: [phone], Fax: [phone] Page: 1 of 3 Call Id: [id number] [facility] - Client TELEPHONE ADVICE RECORD [facility] Patient Name: Carl Roberts Gender: Male DOB: 09-30-1975 Age: 46 Y 35 M 19 D Return Phone Number: [phone] (Primary), [phone] (Secondary) Address: City/ State/ Zip: [place] [address] Client [facility] - Client Client Site [place] Provider Waunita Schooner- MD Contact Type Call Who Is Calling Patient / Member / Family / Caregiver Call Type Triage / Clinical Relationship To Patient Self Return Phone Number [phone] (Primary) Chief Complaint Dizziness Reason for Call Symptomatic / Request for Health Information Initial Comment Call sent from office. Caller is c/o dizzy spells when he moves around suddenly or lies down since yesterday. Also had sinus drainage on Sunday and his left ear feels full. He has an appt for tomorrow. Should he be seen sooner? Translation No Nurse Assessment Nurse: Altamease Oiler, RN, Adriana Date/Time (Eastern Time): 03/18/2022 9:22:27 AM Confirm and document reason for call. If symptomatic, describe symptoms. ---pt states that he has been experiencing dizziness with sudden movements since  yesterday. had sinus drainage since sunday and some chest congestion. left ear feels full. denies pain. some nasal congestion and cough also. Does the patient have any new or worsening symptoms? ---Yes Will a triage be completed? ---Yes Related visit to physician within the last 2 weeks? ---No Does the PT have any chronic conditions? (i.e. diabetes, asthma, this includes High risk factors for pregnancy, etc.) ---Yes List chronic conditions. ---htn Is this a behavioral health or substance abuse call? ---No Guidelines Guideline Title Affirmed Question Affirmed Notes Nurse Date/Time (Eastern Time) Cough - Acute Productive Cough with cold symptoms (e.g., runny nose, postnasal drip, throat clearing) Altamease Oiler, RN, Adriana 03/18/2022 9:24:39 AM Dizziness - Vertigo [1] NO dizziness now AND [2] one or more Altamease Oiler, Therapist, sports, Fabio Bering 03/18/2022 9:27:34 AM PLEASE NOTE: All timestamps contained within this report are represented as [place] Standard Time. CONFIDENTIALTY NOTICE: This fax transmission is intended only for the addressee. It contains information that is legally privileged, confidential or otherwise protected from use or disclosure. If you are not the intended recipient, you are strictly prohibited from reviewing, disclosing, copying using or disseminating any of this information or taking any action in reliance on or regarding this information. If you have received this fax in error, please notify us immediately by telephone so that we can arrange for its return to [place]. Phone: [phone], Toll-Free: [phone], Fax: [phone] Page: 2 of 3 Call Id: [id number] Guidelines Guideline Title Affirmed Question Affirmed Notes Nurse Date/Time Eilene Ghazi Time) stroke risk factors (i.e., hypertension, diabetes, prior stroke/ TIA/heart attack) Disp. Time Eilene Ghazi Time) Disposition Final User 03/18/2022 9:27:15 Stanton, RN, [place] 03/18/2022 9:29:52 AM See PCP within 24 Hours Yes  Altamease Oiler RN, Fabio Bering Final Disposition 03/18/2022 9:29:52 AM See PCP within 24 Hours Yes Altamease Oiler, RN, [employer]  Disagree/Comply Comply Caller Understands Yes PreDisposition Call Doctor Care Advice Given Per Guideline HOME CARE: * You should be able to treat this at home. COUGH MEDICINES: * COUGH DROPS: Over-the-counter cough drops can help a lot, especially for mild coughs. They soothe an irritated throat and remove the tickle sensation in the back of the throat. Cough drops are easy to carry with you. * HOME REMEDY - HARD CANDY: Hard candy works just as well as over-thecounter cough drops. People who have diabetes should use sugar-free candy. * HOME REMEDY - HONEY: This old home remedy has been shown to help decrease coughing at night. The adult dosage is 2 teaspoons (10 ml) at bedtime. NASAL WASHES FOR A STUFFY NOSE: * Introduction: Saline (salt water) nasal irrigation (nasal wash) is an effective and simple home remedy for treating stuffy nose and sinus congestion. The nose can be irrigated by pouring, spraying, or squirting salt water into the nose and then letting it run back out. NASAL WASHES - STEP-BY-STEP INSTRUCTIONS: NASAL DECONGESTANTS FOR A VERY STUFFY NOSE: * Oxymetazoline Nasal Drops (Afrin in [place]; Drixoral in [place]): Available over-the-counter. Clean out the nose before using. Spray each nostril once, wait one minute for absorption, and then spray a second time. CALL BACK IF: * Fever lasts over 3 days * Nasal discharge lasts over 10 days * Earache or facial pain develops * You become worse CARE ADVICE given per Cough - Acute Productive (Adult) guideline. SEE PCP WITHIN 24 HOURS: * IF OFFICE WILL BE OPEN: You need to be examined within the next 24 hours. Call your doctor (or NP/PA) when the office opens and make an appointment. CALL BACK IF: * Severe headache occurs * Weakness develops in an arm or leg * Unable to walk without falling * Dizziness becomes constant * You become worse  CARE ADVICE given per Dizziness - Vertigo (Adult) guideline. FALL PREVENTION: * Sit at side of bed for several minutes before standing up. Stand up slowly. * Avoid sudden movements of head. Comments User: Kizzie Fantasia, RN Date/Time Eilene Ghazi Time): 03/18/2022 9:30:07 AM has appt scheduled for 0900 tomorrow PLEASE NOTE: All timestamps contained within this report are represented as [place] Standard Time. CONFIDENTIALTY NOTICE: This fax transmission is intended only for the addressee. It contains information that is legally privileged, confidential or otherwise protected from use or disclosure. If you are not the intended recipient, you are strictly prohibited from reviewing, disclosing, copying using or disseminating any of this information or taking any action in reliance on or regarding this information. If you have received this fax in error, please notify us immediately by telephone so that we can arrange for its return to [place]. Phone: [phone], Toll-Free: [phone], Fax: [phone] Page: 3 of 3 Call Id: [id number] Referrals REFERRED TO PCP OFFICE

## 2022-03-19 ENCOUNTER — Ambulatory Visit: Payer: BC Managed Care – PPO | Admitting: Family Medicine

## 2022-03-19 VITALS — BP 140/100 | HR 76 | Temp 97.0°F | Ht 73.0 in | Wt 321.0 lb

## 2022-03-19 DIAGNOSIS — H66005 Acute suppurative otitis media without spontaneous rupture of ear drum, recurrent, left ear: Secondary | ICD-10-CM | POA: Insufficient documentation

## 2022-03-19 DIAGNOSIS — I10 Essential (primary) hypertension: Secondary | ICD-10-CM

## 2022-03-19 MED ORDER — AMOXICILLIN-POT CLAVULANATE 875-125 MG PO TABS: 1.0000 | ORAL_TABLET | Freq: Two times a day (BID) | ORAL | 0 refills | Status: AC

## 2022-03-19 NOTE — Progress Notes (Signed)
Subjective:     Carl Roberts is a 46 y.o. male presenting for Dizziness (X 2 days ) and Sinus Problem (X 4 days, with some ear pain)     Dizziness Associated symptoms include congestion and a sore throat. Pertinent negatives include no chills, coughing, headaches or swollen glands.  Sinus Problem This is a new problem. The current episode started in the past 7 days. The problem is unchanged. There has been no fever. Associated symptoms include congestion, ear pain and a sore throat. Pertinent negatives include no chills, coughing, headaches, sinus pressure or swollen glands. Treatments tried: saline rinse, mucinex.   Eating and drinking ok Trying to maintain water Sunday sore throat and drainage Taking mucinex with some improvement Tugging on the ears - pressure sensation    Review of Systems  Constitutional:  Negative for chills.  HENT:  Positive for congestion, ear pain and sore throat. Negative for sinus pressure.   Respiratory:  Negative for cough.   Neurological:  Positive for dizziness. Negative for headaches.     Social History   Tobacco Use  Smoking Status Former   Packs/day: 0.50   Years: 4.00   Total pack years: 2.00   Types: Cigarettes, Pipe   Quit date: 08/03/1998   Years since quitting: 23.6  Smokeless Tobacco Never        Objective:    BP Readings from Last 3 Encounters:  03/19/22 (!) 140/100  12/25/21 (!) 140/100  12/09/21 128/82   Wt Readings from Last 3 Encounters:  03/19/22 (!) 321 lb (145.6 kg)  12/25/21 (!) 322 lb 6 oz (146.2 kg)  12/09/21 (!) 323 lb 6 oz (146.7 kg)    BP (!) 140/100   Pulse 76   Temp (!) 97 F (36.1 C) (Temporal)   Ht '6\' 1"'$  (1.854 m)   Wt (!) 321 lb (145.6 kg)   SpO2 97%   BMI 42.35 kg/m    Physical Exam Constitutional:      General: He is not in acute distress.    Appearance: He is well-developed. He is not ill-appearing.  HENT:     Head: Normocephalic and atraumatic.     Right Ear: Tympanic membrane  and ear canal normal.     Left Ear: Ear canal normal. A middle ear effusion is present.     Nose: Mucosal edema and rhinorrhea present.     Right Sinus: No maxillary sinus tenderness or frontal sinus tenderness.     Left Sinus: No maxillary sinus tenderness or frontal sinus tenderness.     Mouth/Throat:     Pharynx: Uvula midline. Posterior oropharyngeal erythema present. No oropharyngeal exudate.     Tonsils: 0 on the right. 0 on the left.  Cardiovascular:     Rate and Rhythm: Normal rate and regular rhythm.     Heart sounds: No murmur heard. Pulmonary:     Effort: Pulmonary effort is normal. No respiratory distress.     Breath sounds: Normal breath sounds.  Musculoskeletal:     Cervical back: Neck supple.  Lymphadenopathy:     Cervical: No cervical adenopathy.  Skin:    General: Skin is warm and dry.     Capillary Refill: Capillary refill takes less than 2 seconds.  Neurological:     Mental Status: He is alert.           Assessment & Plan:   Problem List Items Addressed This Visit       Cardiovascular and Mediastinum   Essential  hypertension    Elevated. Cont lisinopril-hctz 20-12.5. Home monitoring and update in 2 weeks. If still elevated will likely double dose.         Nervous and Auditory   Recurrent acute suppurative otitis media without spontaneous rupture of left tympanic membrane - Primary    Second episode in the last 6 months. He did get better without additional abx last time. Will try another round of augmentin. Update if no improvement to consider alternative. ENT if another infection      Relevant Medications   amoxicillin-clavulanate (AUGMENTIN) 875-125 MG tablet     Return if symptoms worsen or fail to improve.  Lesleigh Noe, MD

## 2022-03-19 NOTE — Patient Instructions (Signed)
Get a blood pressure cuff  Update in 2 weeks with home readings  Pseudoephedrine for ear pain  Augmentin  Lots of fluids!  Your blood pressure high.   High blood pressure increases your risk for heart attack and stroke.    Please check your blood pressure 2-4 times a week.   To check your blood pressure 1) Sit in a quiet and relaxed place for 5 minutes 2) Make sure your feet are flat on the ground 3) Consider checking first thing in the morning   Normal blood pressure is less than 140/90 Ideally you blood pressure should be around 120/80  Other ways you can reduce your blood pressure:  1) Regular exercise -- Try to get 150 minutes (30 minutes, 5 days a week) of moderate to vigorous aerobic excercise -- Examples: brisk walking (2.5 miles per hour), water aerobics, dancing, gardening, tennis, biking slower than 10 miles per hour 2) DASH Diet - low fat meats, more fresh fruits and vegetables, whole grains, low salt 3) Quit smoking if you smoke 4) Loose 5-10% of your body weight

## 2022-03-19 NOTE — Assessment & Plan Note (Signed)
Elevated. Cont lisinopril-hctz 20-12.5. Home monitoring and update in 2 weeks. If still elevated will likely double dose.

## 2022-03-19 NOTE — Assessment & Plan Note (Signed)
Second episode in the last 6 months. He did get better without additional abx last time. Will try another round of augmentin. Update if no improvement to consider alternative. ENT if another infection

## 2022-04-13 ENCOUNTER — Ambulatory Visit: Payer: BC Managed Care – PPO | Admitting: Dermatology

## 2022-04-13 DIAGNOSIS — L820 Inflamed seborrheic keratosis: Secondary | ICD-10-CM | POA: Diagnosis not present

## 2022-04-13 DIAGNOSIS — L821 Other seborrheic keratosis: Secondary | ICD-10-CM | POA: Diagnosis not present

## 2022-04-13 DIAGNOSIS — D229 Melanocytic nevi, unspecified: Secondary | ICD-10-CM | POA: Diagnosis not present

## 2022-04-13 DIAGNOSIS — L578 Other skin changes due to chronic exposure to nonionizing radiation: Secondary | ICD-10-CM | POA: Diagnosis not present

## 2022-04-13 NOTE — Progress Notes (Unsigned)
   New Patient Visit  Subjective  Carl Roberts is a 46 y.o. male who presents for the following: Skin Problem.  The following portions of the chart were reviewed this encounter and updated as appropriate:   Tobacco  Allergies  Meds  Problems  Med Hx  Surg Hx  Fam Hx     Review of Systems:  No other skin or systemic complaints except as noted in HPI or Assessment and Plan.  Objective  Well appearing patient in no apparent distress; mood and affect are within normal limits.  A focused examination was performed including back,arms. Relevant physical exam findings are noted in the Assessment and Plan.  left deltoid Stuck-on, waxy, tan-brown papule or plaque --Discussed benign etiology and prognosis.    Assessment & Plan  Inflamed seborrheic keratosis left deltoid   Return if symptoms worsen or fail to improve.   IMarye Round, CMA, am acting as scribe for Sarina Ser, MD .

## 2022-04-13 NOTE — Patient Instructions (Signed)
Due to recent changes in healthcare laws, you may see results of your pathology and/or laboratory studies on MyChart before the doctors have had a chance to review them. We understand that in some cases there may be results that are confusing or concerning to you. Please understand that not all results are received at the same time and often the doctors may need to interpret multiple results in order to provide you with the best plan of care or course of treatment. Therefore, we ask that you please give us 2 business days to thoroughly review all your results before contacting the office for clarification. Should we see a critical lab result, you will be contacted sooner.   If You Need Anything After Your Visit  If you have any questions or concerns for your doctor, please call our main line at [phone] and press option 4 to reach your doctor's medical assistant. If no one answers, please leave a voicemail as directed and we will return your call as soon as possible. Messages left after 4 pm will be answered the following business day.   You may also send us a message via MyChart. We typically respond to MyChart messages within 1-2 business days.  For prescription refills, please ask your pharmacy to contact our office. Our fax number is [phone].  If you have an urgent issue when the clinic is closed that cannot wait until the next business day, you can page your doctor at the number below.    Please note that while we do our best to be available for urgent issues outside of office hours, we are not available 24/7.   If you have an urgent issue and are unable to reach us, you may choose to seek medical care at your doctor's office, retail clinic, urgent care center, or emergency room.  If you have a medical emergency, please immediately call 911 or go to the emergency department.  Pager Numbers  - Dr. Kowalski: [phone]  - Dr. Moye: [phone]  - Dr. Stewart:  [phone]  In the event of inclement weather, please call our main line at [phone] for an update on the status of any delays or closures.  Dermatology Medication Tips: Please keep the boxes that topical medications come in in order to help keep track of the instructions about where and how to use these. Pharmacies typically print the medication instructions only on the boxes and not directly on the medication tubes.   If your medication is too expensive, please contact our office at [phone] option 4 or send us a message through MyChart.   We are unable to tell what your co-pay for medications will be in advance as this is different depending on your insurance coverage. However, we may be able to find a substitute medication at lower cost or fill out paperwork to get insurance to cover a needed medication.   If a prior authorization is required to get your medication covered by your insurance company, please allow us 1-2 business days to complete this process.  Drug prices often vary depending on where the prescription is filled and some pharmacies may offer cheaper prices.  The website www.goodrx.com contains coupons for medications through different pharmacies. The prices here do not account for what the cost may be with help from insurance (it may be cheaper with your insurance), but the website can give you the price if you did not use any insurance.  - You can print the associated coupon and take it with   your prescription to the pharmacy.  - You may also stop by our office during regular business hours and pick up a GoodRx coupon card.  - If you need your prescription sent electronically to a different pharmacy, notify our office through [facility] MyChart or by phone at [phone] option 4.     Si Usted Necesita Algo Despus de Su Visita  Tambin puede enviarnos un mensaje a travs de MyChart. Por lo general respondemos a los mensajes de MyChart en el transcurso de 1 a 2  das hbiles.  Para renovar recetas, por favor pida a su farmacia que se ponga en contacto con nuestra oficina. Nuestro nmero de fax es el [phone].  Si tiene un asunto urgente cuando la clnica est cerrada y que no puede esperar hasta el siguiente da hbil, puede llamar/localizar a su doctor(a) al nmero que aparece a continuacin.   Por favor, tenga en cuenta que aunque hacemos todo lo posible para estar disponibles para asuntos urgentes fuera del horario de oficina, no estamos disponibles las 24 horas del da, los 7 das de la semana.   Si tiene un problema urgente y no puede comunicarse con nosotros, puede optar por buscar atencin mdica  en el consultorio de su doctor(a), en una clnica privada, en un centro de atencin urgente o en una sala de emergencias.  Si tiene una emergencia mdica, por favor llame inmediatamente al 911 o vaya a la sala de emergencias.  Nmeros de bper  - Dr. Kowalski: [phone]  - Dra. Moye: [phone]  - Dra. Stewart: [phone]  En caso de inclemencias del tiempo, por favor llame a nuestra lnea principal al [phone] para una actualizacin sobre el estado de cualquier retraso o cierre.  Consejos para la medicacin en dermatologa: Por favor, guarde las cajas en las que vienen los medicamentos de uso tpico para ayudarle a seguir las instrucciones sobre dnde y cmo usarlos. Las farmacias generalmente imprimen las instrucciones del medicamento slo en las cajas y no directamente en los tubos del medicamento.   Si su medicamento es muy caro, por favor, pngase en contacto con nuestra oficina llamando al [phone] y presione la opcin 4 o envenos un mensaje a travs de MyChart.   No podemos decirle cul ser su copago por los medicamentos por adelantado ya que esto es diferente dependiendo de la cobertura de su seguro. Sin embargo, es posible que podamos encontrar un medicamento sustituto a menor costo o llenar un formulario para que el  seguro cubra el medicamento que se considera necesario.   Si se requiere una autorizacin previa para que su compaa de seguros cubra su medicamento, por favor permtanos de 1 a 2 das hbiles para completar este proceso.  Los precios de los medicamentos varan con frecuencia dependiendo del lugar de dnde se surte la receta y alguna farmacias pueden ofrecer precios ms baratos.  El sitio web www.goodrx.com tiene cupones para medicamentos de diferentes farmacias. Los precios aqu no tienen en cuenta lo que podra costar con la ayuda del seguro (puede ser ms barato con su seguro), pero el sitio web puede darle el precio si no utiliz ningn seguro.  - Puede imprimir el cupn correspondiente y llevarlo con su receta a la farmacia.  - Tambin puede pasar por nuestra oficina durante el horario de atencin regular y recoger una tarjeta de cupones de GoodRx.  - Si necesita que su receta se enve electrnicamente a una farmacia diferente, informe a nuestra oficina a travs de MyChart de [facility]   o por telfono llamando al [phone] y presione la opcin 4.  

## 2022-04-14 ENCOUNTER — Encounter: Payer: Self-pay | Admitting: Dermatology

## 2022-05-10 IMAGING — CT CT CARDIAC CORONARY ARTERY CALCIUM SCORE
3 series · 14 of 20 positions shown, 16 images · non-contrast
Comparison: None.
COMPARISON: None.

Addendum:
EXAM:
OVER-READ INTERPRETATION  CT CHEST

The following report is an over-read performed by radiologist Dr.
Siavsh Strnad [REDACTED] on 11/05/2021. This
over-read does not include interpretation of cardiac or coronary
anatomy or pathology. The coronary calcium score interpretation by
the cardiologist is attached.
CLINICAL DATA: Cardiovascular Disease Risk stratification
Coronary Calcium Score
TECHNIQUE: A gated, non-contrast computed tomography scan of the heart was
performed using 3mm slice thickness. Axial images were analyzed on a
dedicated workstation. Calcium scoring of the coronary arteries was
performed using the Agatston method.

[Series 3: cascseq 2.0 sa36 70% (id) · axial · 0.43mm/px · z∈[+1337,+1427]mm · 4 of 76 slices shown]
[im 16/76  vessel]
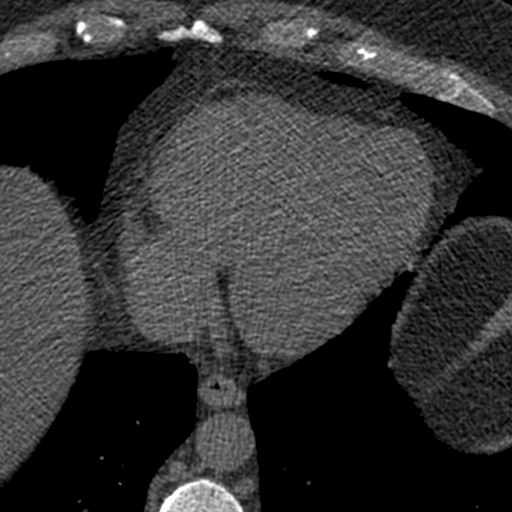
[im 31/76  vessel]
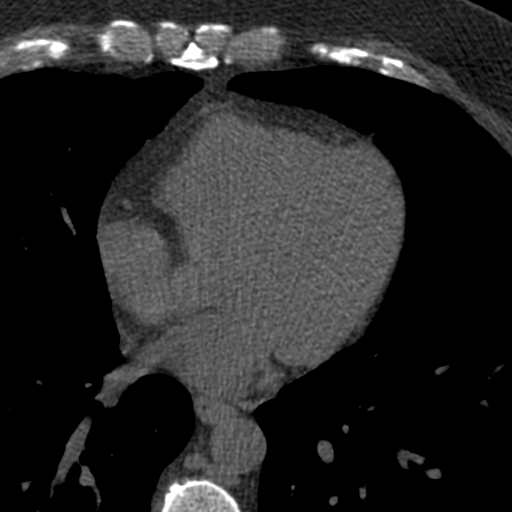
[im 46/76  vessel]
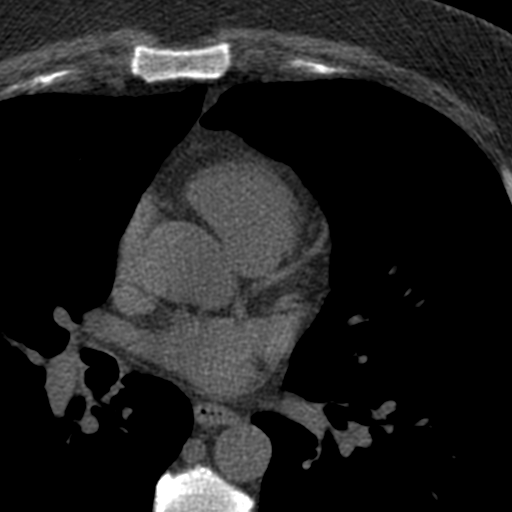
[im 61/76  vessel]
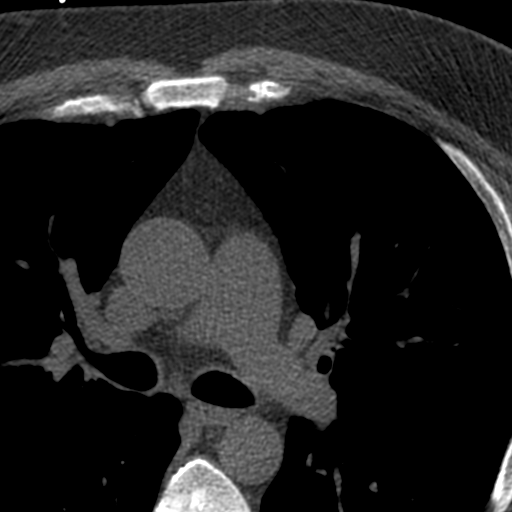

[Series 4: cascseq 2.0 bf37 st · axial · 0.87mm/px · z∈[+1331,+1431]mm · 5 of 76 slices shown, 7 images]
[im 13/76  vessel]
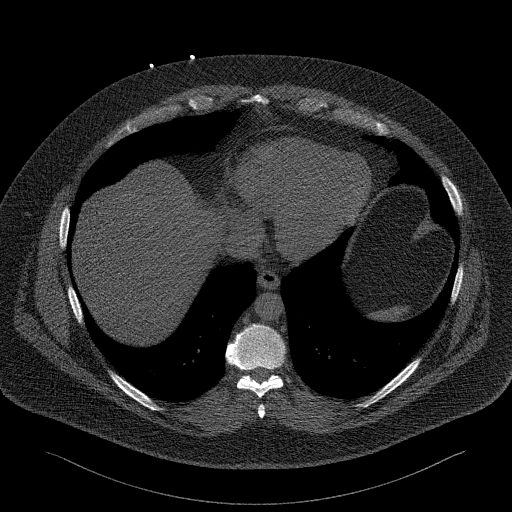
[im 13/76  lung]
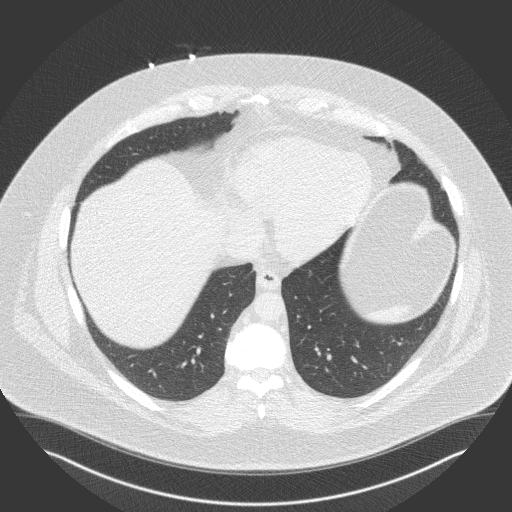
[im 26/76  vessel]
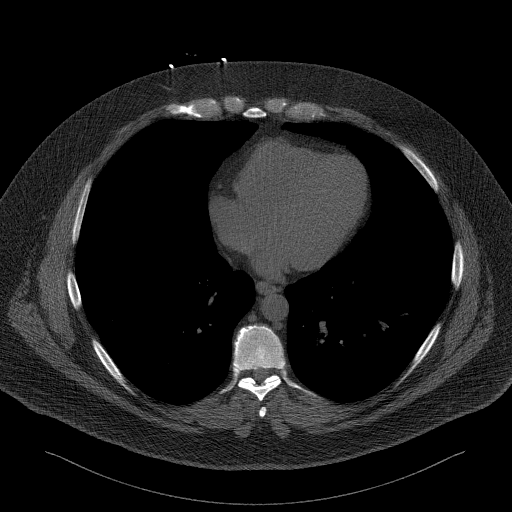
[im 38/76  vessel]
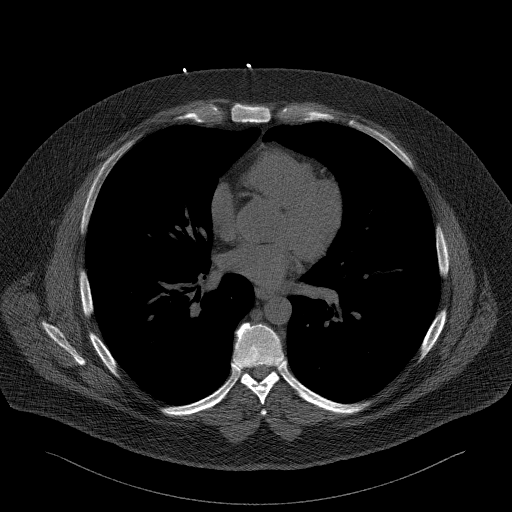
[im 51/76  vessel]
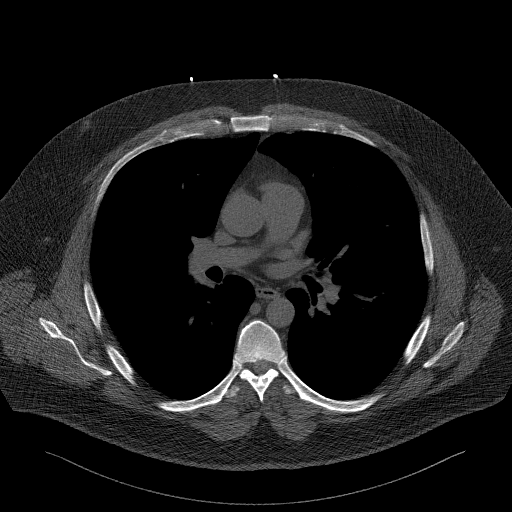
[im 63/76  vessel]
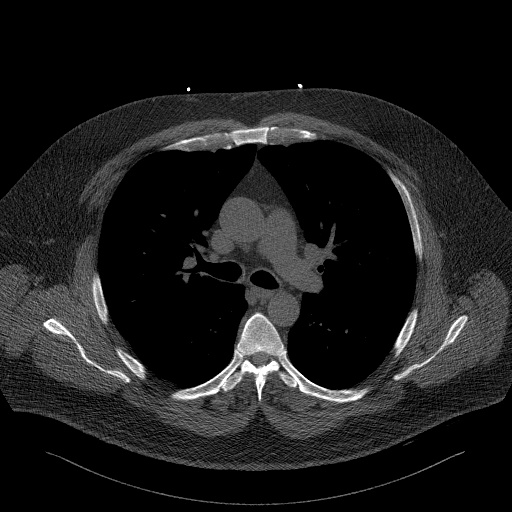
[im 63/76  lung]
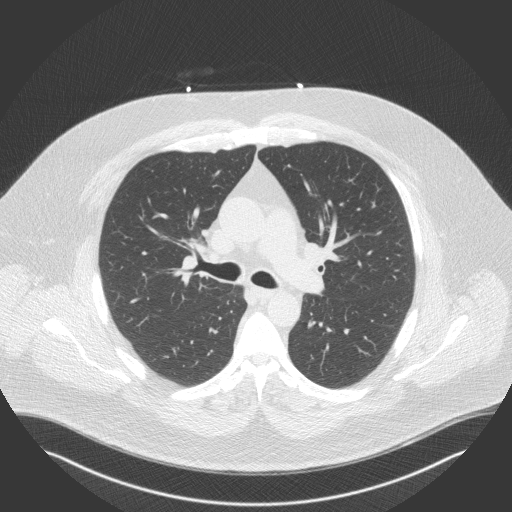

[Series 5: cascseq 2.0 br59 lung · axial · 0.87mm/px · z∈[+1331,+1431]mm · 5 of 76 slices shown]
[im 13/76  lung]
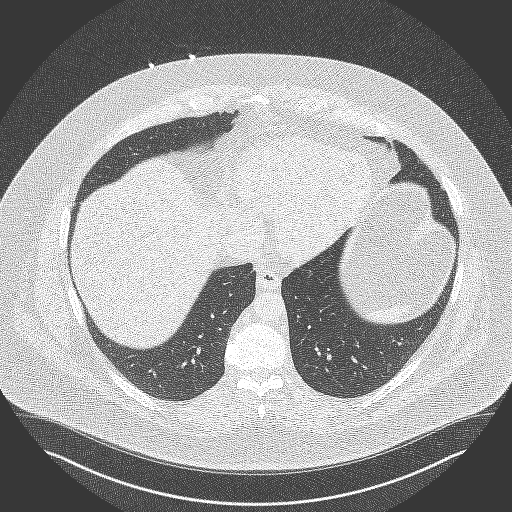
[im 26/76  lung]
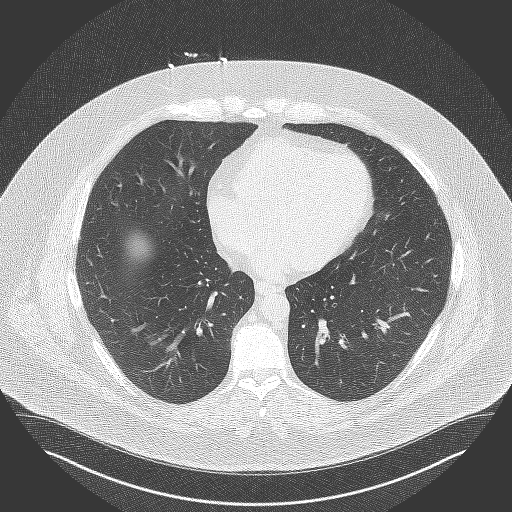
[im 38/76  lung]
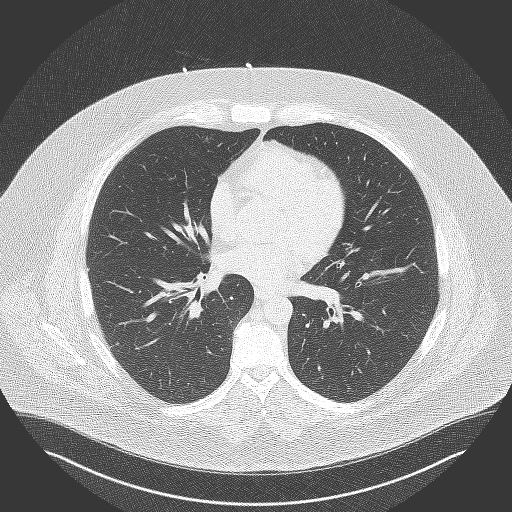
[im 51/76  lung]
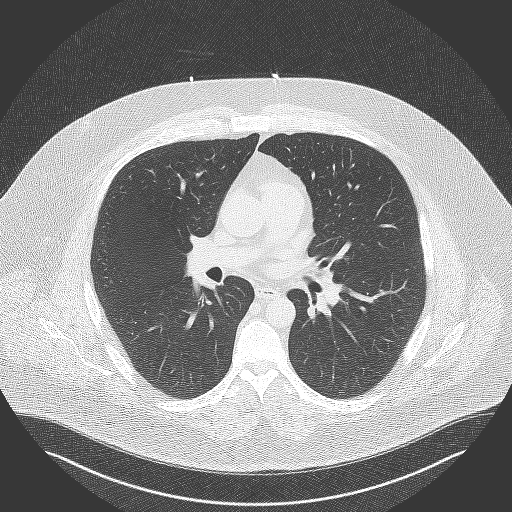
[im 63/76  lung]
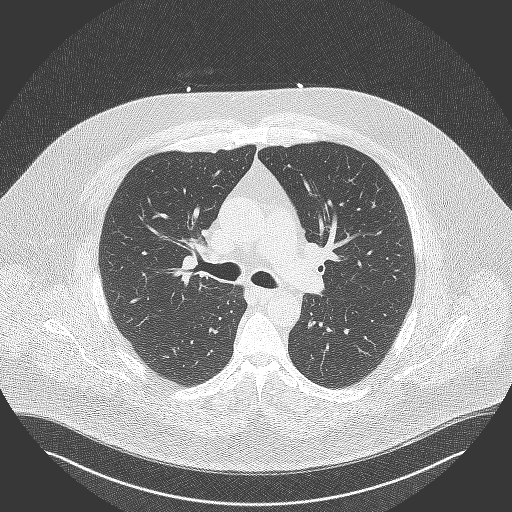

[14 of 20 positions shown; findings below may reference images not displayed]

FINDINGS: Within the visualized portions of the thorax there are no suspicious
appearing pulmonary nodules or masses, there is no acute
consolidative airspace disease, no pleural effusions, no
pneumothorax and no lymphadenopathy. Visualized portions of the
upper abdomen demonstrates diffuse low attenuation throughout the
visualized hepatic parenchyma, indicative of hepatic steatosis.
There are no aggressive appearing lytic or blastic lesions noted in
the visualized portions of the skeleton.
IMPRESSION: 1. Hepatic steatosis.
FINDINGS: Coronary arteries: Normal origins.

Coronary Calcium Score:

Left main:

Left anterior descending artery: 63

Left circumflex artery:

Right coronary artery:

Total: 63

Percentile:

Pericardium: Normal.

Ascending Aorta: Mild dilatation measuring 39mm

Non-cardiac: See separate report from [REDACTED].
IMPRESSION: 1. Coronary calcium score of 63. Percentiles are not available for
age <45, but would be 93rd percentile for age 45.

2.  Mild dilatation ascending aorta measuring 39mm

3.  Dilated main pulmonary artery measuring 34mm



If CAC=0, it is reasonable to withhold statin therapy and reassess
in 5 to 10 years, as long as higher risk conditions are absent
(diabetes mellitus, family history of premature CHD in first degree
relatives (males <55 years; females <65 years), cigarette smoking,
or LDL >=190 mg/dL).

If CAC is 1 to 99, it is reasonable to initiate statin therapy for
patients >=55 years of age.

If CAC is >=100 or >=75th percentile, it is reasonable to initiate
statin therapy at any age.

Cardiology referral should be considered for patients with CAC
scores >=400 or >=75th percentile.

*7083 AHA/ACC/AACVPR/AAPA/ABC/ZECHARIAH/MOATSHE/TAMESHA/Rosner/PIWIARNIA/NEMESIO/SAA
Guideline on the Management of Blood Cholesterol: A Report of the
American College of Cardiology/American Heart Association Task Force
on Clinical Practice Guidelines. J Am Coll Cardiol.
4650;73(24):5739-5051.

*** End of Addendum ***
EXAM:
OVER-READ INTERPRETATION  CT CHEST

The following report is an over-read performed by radiologist Dr.
Siavsh Strnad [REDACTED] on 11/05/2021. This
over-read does not include interpretation of cardiac or coronary
anatomy or pathology. The coronary calcium score interpretation by
the cardiologist is attached.
FINDINGS: Within the visualized portions of the thorax there are no suspicious
appearing pulmonary nodules or masses, there is no acute
consolidative airspace disease, no pleural effusions, no
pneumothorax and no lymphadenopathy. Visualized portions of the
upper abdomen demonstrates diffuse low attenuation throughout the
visualized hepatic parenchyma, indicative of hepatic steatosis.
There are no aggressive appearing lytic or blastic lesions noted in
the visualized portions of the skeleton.
IMPRESSION: 1. Hepatic steatosis.

## 2022-05-13 ENCOUNTER — Telehealth (INDEPENDENT_AMBULATORY_CARE_PROVIDER_SITE_OTHER): Payer: BC Managed Care – PPO | Admitting: Family Medicine

## 2022-05-13 DIAGNOSIS — I10 Essential (primary) hypertension: Secondary | ICD-10-CM | POA: Diagnosis not present

## 2022-05-13 MED ORDER — AMLODIPINE BESYLATE 5 MG PO TABS: 5.0000 mg | ORAL_TABLET | Freq: Every day | ORAL | 11 refills | Status: DC

## 2022-05-13 NOTE — Assessment & Plan Note (Signed)
Chronic, inadequate control despite increased dose of lisinopril hydrochlorothiazide to max.  He has been working on [employer], exercise and weight management.  He has been avoiding high salt foods, caffeine and alcohol. He is agreeable to adding amlodipine 5 mg p.o. daily.  Side effects and course of treatment reviewed.  He will follow his blood pressure at home with an arm cuff over the next 1 to 2 weeks and send a message via MyChart or make an appointment for follow-up to review the measurements especially if not at goal. I encouraged him to continue working on weight loss and lifestyle change to hopefully reduce blood pressure medication over the next 3 to 6 months. Initial goal of weight loss for the next year is 10% current body weight

## 2022-05-13 NOTE — Progress Notes (Signed)
VIRTUAL VISIT A virtual visit is felt to be most appropriate for this patient at this time.   I connected with the patient on 05/13/22 at  9:00 AM EDT by virtual telehealth platform and verified that I am speaking with the correct person using two identifiers.   I discussed the limitations, risks, security and privacy concerns of performing an evaluation and management service by  virtual telehealth platform and the availability of in person appointments. I also discussed with the patient that there may be a patient responsible charge related to this service. The patient expressed understanding and agreed to proceed.  Patient location: Home Provider Location: [facility] [place] Participants: Eliezer Lofts and Arnette Norris   Chief Complaint  Patient presents with   Hypertension    140-150 over 90-100 x 3 weeks     History of Present Illness: 46 year old male patient, previous PCP Dr. Einar Pheasant with recent history of increased blood pressure over the last 3 weeks.  Per his last office visit regarding hypertension in 02/2022 he was continued on lisinopril hydrochlorothiazide 20/12.5 mg  1 tablets  p.o. daily.  He increased to 2 tab daily in the last 3 weeks.  He is on testosterone injections weekly. Last CBC showed hemoglobin of 14.1 on 11/18/21  Hypertension:  He has noted blood pressure running between 140-150/90-100  Arm cuff.  No headache, no blurred vision Using medication without problems or lightheadedness:  none Chest pain with exertion:none Edema:none Short of breath: none Average home BPs: Other issues:  Wt Readings from Last 3 Encounters:  03/19/22 (!) 321 lb (145.6 kg)  12/25/21 (!) 322 lb 6 oz (146.2 kg)  12/09/21 (!) 323 lb 6 oz (146.7 kg)    Stable diet... minimizing salt and ETOH  Minimal exercise.  COVID 19 screen No recent travel or known exposure to COVID19 The patient denies respiratory symptoms of COVID 19 at this time.  The importance of social distancing  was discussed today.   Review of Systems  Constitutional:  Negative for chills and fever.  HENT:  Negative for congestion and ear pain.   Eyes:  Negative for pain and redness.  Respiratory:  Negative for cough and shortness of breath.   Cardiovascular:  Negative for chest pain, palpitations and leg swelling.  Gastrointestinal:  Negative for abdominal pain, blood in stool, constipation, diarrhea, nausea and vomiting.  Genitourinary:  Negative for dysuria.  Musculoskeletal:  Negative for falls and myalgias.  Skin:  Negative for rash.  Neurological:  Negative for dizziness.  Psychiatric/Behavioral:  Negative for depression. The patient is not nervous/anxious.       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 His smoking use included cigarettes and pipe. He has a 2.00 pack-year smoking history. He has never used smokeless tobacco. He reports current alcohol use. He reports that he does not use drugs.   Current Outpatient Medications:    lisinopril-hydrochlorothiazide (ZESTORETIC) 20-12.5 MG tablet, TAKE 1 TABLET BY MOUTH EVERY DAY, Disp: 90 tablet, Rfl: 3   NEEDLE, DISP, 25 G (BD HYPODERMIC NEEDLE) 25G X 1-1/2" MISC, USE 1 EACH EVERY 14 (FOURTEEN) DAYS (FOR ADMINISTERING THE MEDICATION), Disp: 50 each, Rfl: 3   testosterone cypionate (DEPOTESTOSTERONE CYPIONATE) 200 MG/ML injection, INJECT 0.5  MLS (100 MG TOTAL) INTO THE MUSCLE ONCE A WEEK., Disp: 6 mL, Rfl: 0   triamcinolone cream (KENALOG) 0.1 %, APPLY TO AFFECTED AREA TWICE A DAY, Disp: 30 g, Rfl: 0   atorvastatin (LIPITOR) 10 MG tablet, Take 1 tablet (10 mg total) by mouth daily. (Patient not taking: Reported on 05/13/2022), Disp: 90 tablet, Rfl: 3   Observations/Objective: There were no vitals taken for this visit.  Physical Exam   Physical Exam Constitutional:      General: The patient is not in acute distress. Pulmonary:     Effort: Pulmonary effort is normal. No respiratory distress.  Neurological:     Mental Status: The patient is alert and oriented to person, place, and time.  Psychiatric:        Mood and Affect: Mood normal.        Behavior: Behavior normal.   Assessment and Plan    I discussed the assessment and treatment plan with the patient. The patient was provided an opportunity to ask questions and all were answered. The patient agreed with the plan and demonstrated an understanding of the instructions.   The patient was advised to call back or seek an in-person evaluation if the symptoms worsen or if the condition fails to improve as anticipated.  Problem List Items Addressed This Visit     Essential hypertension - Primary    Chronic, inadequate control despite increased dose of lisinopril hydrochlorothiazide to max.  He has been working on [employer], exercise and weight management.  He has been avoiding high salt foods, caffeine and alcohol. He is agreeable to adding amlodipine 5 mg p.o. daily.  Side effects and course of treatment reviewed.  He will follow his blood pressure at home with an arm cuff over the next 1 to 2 weeks and send a message via MyChart or make an appointment for follow-up to review the measurements especially if not at goal. I encouraged him to continue working on weight loss and lifestyle change to hopefully reduce blood pressure medication over the next 3 to 6 months. Initial goal of weight loss for the next year is 10% current body weight      Relevant Medications   amLODipine (NORVASC) 5 MG tablet   Meds ordered this encounter  Medications   amLODipine (NORVASC) 5 MG tablet    Sig: Take 1 tablet (5 mg total) by mouth daily.    Dispense:  30 tablet    Refill:  [place], MD

## 2022-05-20 DIAGNOSIS — G4733 Obstructive sleep apnea (adult) (pediatric): Secondary | ICD-10-CM | POA: Diagnosis not present

## 2022-06-03 ENCOUNTER — Encounter: Payer: Self-pay | Admitting: Family Medicine

## 2022-06-03 ENCOUNTER — Ambulatory Visit: Payer: BC Managed Care – PPO | Admitting: Family Medicine

## 2022-06-03 VITALS — BP 160/76 | HR 91 | Temp 97.5°F | Ht 73.0 in | Wt 321.0 lb

## 2022-06-03 DIAGNOSIS — I10 Essential (primary) hypertension: Secondary | ICD-10-CM

## 2022-06-03 DIAGNOSIS — J4520 Mild intermittent asthma, uncomplicated: Secondary | ICD-10-CM

## 2022-06-03 DIAGNOSIS — K760 Fatty (change of) liver, not elsewhere classified: Secondary | ICD-10-CM

## 2022-06-03 DIAGNOSIS — E559 Vitamin D deficiency, unspecified: Secondary | ICD-10-CM

## 2022-06-03 DIAGNOSIS — G4733 Obstructive sleep apnea (adult) (pediatric): Secondary | ICD-10-CM | POA: Diagnosis not present

## 2022-06-03 DIAGNOSIS — E782 Mixed hyperlipidemia: Secondary | ICD-10-CM

## 2022-06-03 DIAGNOSIS — E291 Testicular hypofunction: Secondary | ICD-10-CM

## 2022-06-03 NOTE — Assessment & Plan Note (Signed)
No recent issues

## 2022-06-03 NOTE — Assessment & Plan Note (Signed)
Well controlled on vit D  5000 IU daily

## 2022-06-03 NOTE — Assessment & Plan Note (Addendum)
Reviewed lifestyle  changes and  associated risks including CVD.

## 2022-06-03 NOTE — Assessment & Plan Note (Signed)
Noted  incidentally on CT scan. Nml liver function tests.

## 2022-06-03 NOTE — Progress Notes (Signed)
Patient ID: Carl Roberts, male    DOB: 1975-11-25, 46 y.o.   MRN: [id number]  This visit was conducted in person.  BP (!) 156/78   Pulse 91   Temp (!) 97.5 F (36.4 C) (Temporal)   Ht '6\' 1"'$  (1.854 m)   Wt (!) 321 lb (145.6 kg)   SpO2 96%   BMI 42.35 kg/m    CC:  Chief Complaint  Patient presents with   Hypertension    Here for f/u. Pt brought in home BP monitor to compare. Reading in office today-     Subjective:   HPI: Carl Roberts is a 46 y.o. male presenting on 06/03/2022 for Hypertension (Here for f/u. Pt brought in home BP monitor to compare. Reading in office today- )   Previous PCP Carl Roberts... has not yet established  Last CPX reviewed from 09/2021   Video visit from 05/13/22 reviewed... on max lisinopril / HCTZ. Added amlodipine 5 mg daily   Hypertension:   Improved but not yet at goal < 140/90 in office today. ( Pt never started taking amlodipine because he was worried about using 2 meds).   He has been using beet juice.  Using medication without problems or lightheadedness:  Chest pain with exertion: Edema: Short of breath: Average home BPs: 158-60/ 90 Other issues:   Morbid obesity Wt Readings from Last 3 Encounters:  06/03/22 (!) 321 lb (145.6 kg)  03/19/22 (!) 321 lb (145.6 kg)  12/25/21 (!) 322 lb 6 oz (146.2 kg)   BP Readings from Last 3 Encounters:  06/03/22 (!) 160/76  03/19/22 (!) 140/100  12/25/21 (!) 140/100        Relevant past medical, surgical, family and social history reviewed and updated as indicated. Interim medical history since our last visit reviewed. Allergies and medications reviewed and updated. Outpatient Medications Prior to Visit  Medication Sig Dispense Refill   lisinopril-hydrochlorothiazide (ZESTORETIC) 20-12.5 MG tablet TAKE 1 TABLET BY MOUTH EVERY DAY 90 tablet 3   NEEDLE, DISP, 25 G (BD HYPODERMIC NEEDLE) 25G X 1-1/2" MISC USE 1 EACH EVERY 14 (FOURTEEN) DAYS (FOR ADMINISTERING THE MEDICATION) 50 each 3    testosterone cypionate (DEPOTESTOSTERONE CYPIONATE) 200 MG/ML injection INJECT 0.5 MLS (100 MG TOTAL) INTO THE MUSCLE ONCE A WEEK. 6 mL 0   triamcinolone cream (KENALOG) 0.1 % APPLY TO AFFECTED AREA TWICE A DAY (Patient taking differently: APPLY TO AFFECTED AREA TWICE A DAY. AS NEEDED.) 30 g 0   amLODipine (NORVASC) 5 MG tablet Take 1 tablet (5 mg total) by mouth daily. (Patient not taking: Reported on 06/03/2022) 30 tablet 11   atorvastatin (LIPITOR) 10 MG tablet Take 1 tablet (10 mg total) by mouth daily. (Patient not taking: Reported on 05/13/2022) 90 tablet 3   No facility-administered medications prior to visit.     Per HPI unless specifically indicated in ROS section below Review of Systems  Constitutional:  Negative for fatigue and fever.  HENT:  Negative for ear pain.   Eyes:  Negative for pain.  Respiratory:  Negative for cough and shortness of breath.   Cardiovascular:  Negative for chest pain, palpitations and leg swelling.  Gastrointestinal:  Negative for abdominal pain.  Genitourinary:  Negative for dysuria.  Musculoskeletal:  Negative for arthralgias.  Neurological:  Negative for syncope, light-headedness and headaches.  Psychiatric/Behavioral:  Negative for dysphoric mood.    Objective:  BP (!) 156/78   Pulse 91   Temp (!) 97.5 F (36.4 C) (Temporal)  Ht '6\' 1"'$  (1.854 m)   Wt (!) 321 lb (145.6 kg)   SpO2 96%   BMI 42.35 kg/m   Wt Readings from Last 3 Encounters:  06/03/22 (!) 321 lb (145.6 kg)  03/19/22 (!) 321 lb (145.6 kg)  12/25/21 (!) 322 lb 6 oz (146.2 kg)      Physical Exam Constitutional:      Appearance: He is well-developed. He is obese.  HENT:     Head: Normocephalic.     Right Ear: Hearing normal.     Left Ear: Hearing normal.     Nose: Nose normal.  Neck:     Thyroid: No thyroid mass or thyromegaly.     Vascular: No carotid bruit.     Trachea: Trachea normal.  Cardiovascular:     Rate and Rhythm: Normal rate and regular rhythm.     Pulses:  Normal pulses.     Heart sounds: Heart sounds not distant. No murmur heard.    No friction rub. No gallop.     Comments: No peripheral edema Pulmonary:     Effort: Pulmonary effort is normal. No respiratory distress.     Breath sounds: Normal breath sounds.  Skin:    General: Skin is warm and dry.     Findings: No rash.  Psychiatric:        Speech: Speech normal.        Behavior: Behavior normal.        Thought Content: Thought content normal.       Results for orders placed or performed in visit on 11/18/21  VITAMIN D 25 Hydroxy (Vit-D Deficiency, Fractures)  Result Value Ref Range   VITD 22.60 (L) 30.00 - 100.00 ng/mL  Testosterone  Result Value Ref Range   Testosterone 540.99 300.00 - 890.00 ng/dL  CBC with Differential/Platelet  Result Value Ref Range   WBC 7.8 4.0 - 10.5 K/uL   RBC 4.42 4.22 - 5.81 Mil/uL   Hemoglobin 14.1 13.0 - 17.0 g/dL   HCT 41.2 39.0 - 52.0 %   MCV 93.3 78.0 - 100.0 fl   MCHC 34.2 30.0 - 36.0 g/dL   RDW 13.7 11.5 - 15.5 %   Platelets 257.0 150.0 - 400.0 K/uL   Neutrophils Relative % 65.1 43.0 - 77.0 %   Lymphocytes Relative 23.6 12.0 - 46.0 %   Monocytes Relative 8.2 3.0 - 12.0 %   Eosinophils Relative 2.3 0.0 - 5.0 %   Basophils Relative 0.8 0.0 - 3.0 %   Neutro Abs 5.1 1.4 - 7.7 K/uL   Lymphs Abs 1.8 0.7 - 4.0 K/uL   Monocytes Absolute 0.6 0.1 - 1.0 K/uL   Eosinophils Absolute 0.2 0.0 - 0.7 K/uL   Basophils Absolute 0.1 0.0 - 0.1 K/uL  Comprehensive metabolic panel  Result Value Ref Range   Sodium 136 135 - 145 mEq/L   Potassium 4.1 3.5 - 5.1 mEq/L   Chloride 100 96 - 112 mEq/L   CO2 28 19 - 32 mEq/L   Glucose, Bld 102 (H) 70 - 99 mg/dL   BUN 13 6 - 23 mg/dL   Creatinine, Ser 1.21 0.40 - 1.50 mg/dL   Total Bilirubin 0.5 0.2 - 1.2 mg/dL   Alkaline Phosphatase 58 39 - 117 U/L   AST 11 0 - 37 U/L   ALT 39 0 - 53 U/L   Total Protein 7.2 6.0 - 8.3 g/dL   Albumin 4.5 3.5 - 5.2 g/dL   GFR 73.05 >60.00 mL/min  Calcium 9.9 8.4 - 10.5  mg/dL  Lipase  Result Value Ref Range   Lipase 32.0 11.0 - 59.0 U/L  POCT Urinalysis Dipstick (Automated)  Result Value Ref Range   Color, UA Yellow    Clarity, UA Clear    Glucose, UA Negative Negative   Bilirubin, UA Negative    Ketones, UA Negative    Spec Grav, UA 1.015 1.010 - 1.025   Blood, UA Negative    pH, UA 7.0 5.0 - 8.0   Protein, UA Negative Negative   Urobilinogen, UA 0.2 0.2 or 1.0 E.U./dL   Nitrite, UA Negative    Leukocytes, UA Trace (A) Negative     COVID 19 screen:  No recent travel or known exposure to COVID19 The patient denies respiratory symptoms of COVID 19 at this time. The importance of social distancing was discussed today.   Assessment and Plan Problem List Items Addressed This Visit     Childhood asthma without complication     No recent issues.      Essential hypertension - Primary     Chronic,  inadequate control.  Will start amlodipine  and work on lifestyle changes.      Relevant Orders   Comprehensive metabolic panel   Fatty liver     Noted  incidentally on CT scan. Nml liver function tests.      Hyperlipidemia, mixed     Working oin lifestyle changes... wants to try this before starting atorvastatin.      Relevant Orders   Hemoglobin A1c   Lipid panel   Hypogonadism in male     On testosterone  100 mg weekly.      Relevant Orders   CBC with Differential/Platelet   Testosterone   Morbid obesity ([place])    Reviewed lifestyle  changes and  associated risks including CVD.      OSA on CPAP     Well controlled on CPAP. Compliant.      Vitamin D deficiency     Well controlled on vit D  5000 IU daily          Eliezer Lofts, MD

## 2022-06-03 NOTE — Patient Instructions (Addendum)
Start amlodipine 5 mg daily.  Follow BP daily.Marland Kitchen goal < 140/90.  Call with measurements in 2 weeks or so.

## 2022-06-03 NOTE — Assessment & Plan Note (Signed)
On testosterone  100 mg weekly.

## 2022-06-03 NOTE — Assessment & Plan Note (Signed)
Well controlled on CPAP. Compliant.

## 2022-06-03 NOTE — Assessment & Plan Note (Signed)
Working oin lifestyle changes... wants to try this before starting atorvastatin.

## 2022-06-03 NOTE — Assessment & Plan Note (Signed)
Chronic,  inadequate control.  Will start amlodipine  and work on lifestyle changes.

## 2022-06-19 DIAGNOSIS — G4733 Obstructive sleep apnea (adult) (pediatric): Secondary | ICD-10-CM | POA: Diagnosis not present

## 2022-07-20 DIAGNOSIS — G4733 Obstructive sleep apnea (adult) (pediatric): Secondary | ICD-10-CM | POA: Diagnosis not present

## 2022-08-27 ENCOUNTER — Other Ambulatory Visit: Payer: Self-pay | Admitting: *Deleted

## 2022-08-27 DIAGNOSIS — E291 Testicular hypofunction: Secondary | ICD-10-CM

## 2022-08-27 MED ORDER — TESTOSTERONE CYPIONATE 200 MG/ML IM SOLN: 100.0000 mg | INTRAMUSCULAR | 0 refills | Status: DC

## 2022-08-27 NOTE — Telephone Encounter (Signed)
Last office visit 06/03/22 for HTN.  Last refilled 03/03/2022 for 6 ml with no refills.  Last Testosterone level 11/18/21 which was normal at 540.99 ng/dL.  Next Appt: No future appointments.

## 2022-08-28 ENCOUNTER — Encounter: Payer: Self-pay | Admitting: Family Medicine

## 2022-08-28 DIAGNOSIS — I10 Essential (primary) hypertension: Secondary | ICD-10-CM

## 2022-08-28 MED ORDER — LISINOPRIL-HYDROCHLOROTHIAZIDE 20-12.5 MG PO TABS: 2.0000 | ORAL_TABLET | Freq: Every day | ORAL | 1 refills | Status: DC

## 2022-08-28 NOTE — Addendum Note (Signed)
Addended by: Carter Kitten on: 08/28/2022 05:13 PM   Modules accepted: Orders

## 2022-08-28 NOTE — Telephone Encounter (Signed)
Please advise 

## 2022-08-28 NOTE — Telephone Encounter (Signed)
Okay to make change as requested.  2 tabs of lisinopril HCTZ daily

## 2022-09-14 ENCOUNTER — Ambulatory Visit: Admit: 2022-09-14 | Discharge status: Absent without leave | Payer: BC Managed Care – PPO

## 2022-09-22 ENCOUNTER — Ambulatory Visit: Payer: BC Managed Care – PPO | Admitting: Family Medicine

## 2022-09-22 ENCOUNTER — Encounter: Payer: Self-pay | Admitting: Family Medicine

## 2022-09-22 VITALS — BP 130/82 | HR 90 | Temp 97.9°F | Ht 73.0 in | Wt 327.1 lb

## 2022-09-22 DIAGNOSIS — H6591 Unspecified nonsuppurative otitis media, right ear: Secondary | ICD-10-CM

## 2022-09-22 MED ORDER — AMOXICILLIN 500 MG PO CAPS: 1000.0000 mg | ORAL_CAPSULE | Freq: Two times a day (BID) | ORAL | 0 refills | Status: DC

## 2022-09-22 NOTE — Assessment & Plan Note (Signed)
Acute, possibly associated with right sinus infection.  Will treat with amoxicillin 500 mg 2 tabs twice daily for 10 days.   Continue nasal saline spray daily, add Flonase 2 sprays per nostril daily to help with drainage of effusion. No evidence of eardrum rupture. Return and ER precautions provided.

## 2022-09-22 NOTE — Progress Notes (Signed)
Patient ID: Carl Roberts, male    DOB: 12-28-1975, 47 y.o.   MRN: [id number]  This visit was conducted in person.  BP 130/82   Pulse 90   Temp 97.9 F (36.6 C) (Temporal)   Ht '6\' 1"'$  (1.854 m)   Wt (!) 327 lb 2 oz (148.4 kg)   SpO2 96%   BMI 43.16 kg/m    CC:  Chief Complaint  Patient presents with   Ear Fullness    Right "Feels like ear is under water"    Subjective:   HPI: Carl Roberts is a 47 y.o. male presenting on 09/22/2022 for Ear Fullness (Right "Feels like ear is under water")   He reports new onset  fullness in right ear ongoing x 2 weeks.  Proceeding nasal congestion cold, no allergies.  Occurred suddenly at hockey game,  right facial pain.  He had been yelling loud.Marland Kitchen occ pain in right ear.   No fever.   Has been using motrin for pain off and on with sudafed.  Decreased hearing in right ear... underwater sound.      Relevant past medical, surgical, family and social history reviewed and updated as indicated. Interim medical history since our last visit reviewed. Allergies and medications reviewed and updated. Outpatient Medications Prior to Visit  Medication Sig Dispense Refill   amLODipine (NORVASC) 5 MG tablet Take 1 tablet (5 mg total) by mouth daily. 30 tablet 11   lisinopril-hydrochlorothiazide (ZESTORETIC) 20-12.5 MG tablet Take 2 tablets by mouth daily. 180 tablet 1   NEEDLE, DISP, 25 G (BD HYPODERMIC NEEDLE) 25G X 1-1/2" MISC USE 1 EACH EVERY 14 (FOURTEEN) DAYS (FOR ADMINISTERING THE MEDICATION) 50 each 3   testosterone cypionate (DEPOTESTOSTERONE CYPIONATE) 200 MG/ML injection Inject 0.5 mLs (100 mg total) into the muscle once a week. 6 mL 0   triamcinolone cream (KENALOG) 0.1 % Apply 1 Application topically 2 (two) times daily as needed.     triamcinolone cream (KENALOG) 0.1 % APPLY TO AFFECTED AREA TWICE A DAY (Patient taking differently: APPLY TO AFFECTED AREA TWICE A DAY. AS NEEDED.) 30 g 0   No facility-administered medications prior  to visit.     Per HPI unless specifically indicated in ROS section below Review of Systems  Constitutional:  Negative for fatigue and fever.  HENT:  Positive for congestion, ear pain and sinus pain.   Eyes:  Negative for pain.  Respiratory:  Negative for cough and shortness of breath.   Cardiovascular:  Negative for chest pain, palpitations and leg swelling.  Gastrointestinal:  Negative for abdominal pain.  Genitourinary:  Negative for dysuria.  Musculoskeletal:  Negative for arthralgias.  Neurological:  Negative for syncope, light-headedness and headaches.  Psychiatric/Behavioral:  Negative for dysphoric mood.    Objective:  BP 130/82   Pulse 90   Temp 97.9 F (36.6 C) (Temporal)   Ht '6\' 1"'$  (1.854 m)   Wt (!) 327 lb 2 oz (148.4 kg)   SpO2 96%   BMI 43.16 kg/m   Wt Readings from Last 3 Encounters:  09/22/22 (!) 327 lb 2 oz (148.4 kg)  06/03/22 (!) 321 lb (145.6 kg)  03/19/22 (!) 321 lb (145.6 kg)      Physical Exam Constitutional:      Appearance: He is well-developed.  HENT:     Head: Normocephalic.     Right Ear: Hearing normal. A middle ear effusion is present. Tympanic membrane is injected and bulging.     Left Ear: Hearing,  tympanic membrane, ear canal and external ear normal.     Nose: Nose normal.  Neck:     Thyroid: No thyroid mass or thyromegaly.     Vascular: No carotid bruit.     Trachea: Trachea normal.  Cardiovascular:     Rate and Rhythm: Normal rate and regular rhythm.     Pulses: Normal pulses.     Heart sounds: Heart sounds not distant. No murmur heard.    No friction rub. No gallop.     Comments: No peripheral edema Pulmonary:     Effort: Pulmonary effort is normal. No respiratory distress.     Breath sounds: Normal breath sounds.  Skin:    General: Skin is warm and dry.     Findings: No rash.  Psychiatric:        Speech: Speech normal.        Behavior: Behavior normal.        Thought Content: Thought content normal.       Results for  orders placed or performed in visit on 11/18/21  VITAMIN D 25 Hydroxy (Vit-D Deficiency, Fractures)  Result Value Ref Range   VITD 22.60 (L) 30.00 - 100.00 ng/mL  Testosterone  Result Value Ref Range   Testosterone 540.99 300.00 - 890.00 ng/dL  CBC with Differential/Platelet  Result Value Ref Range   WBC 7.8 4.0 - 10.5 K/uL   RBC 4.42 4.22 - 5.81 Mil/uL   Hemoglobin 14.1 13.0 - 17.0 g/dL   HCT 41.2 39.0 - 52.0 %   MCV 93.3 78.0 - 100.0 fl   MCHC 34.2 30.0 - 36.0 g/dL   RDW 13.7 11.5 - 15.5 %   Platelets 257.0 150.0 - 400.0 K/uL   Neutrophils Relative % 65.1 43.0 - 77.0 %   Lymphocytes Relative 23.6 12.0 - 46.0 %   Monocytes Relative 8.2 3.0 - 12.0 %   Eosinophils Relative 2.3 0.0 - 5.0 %   Basophils Relative 0.8 0.0 - 3.0 %   Neutro Abs 5.1 1.4 - 7.7 K/uL   Lymphs Abs 1.8 0.7 - 4.0 K/uL   Monocytes Absolute 0.6 0.1 - 1.0 K/uL   Eosinophils Absolute 0.2 0.0 - 0.7 K/uL   Basophils Absolute 0.1 0.0 - 0.1 K/uL  Comprehensive metabolic panel  Result Value Ref Range   Sodium 136 135 - 145 mEq/L   Potassium 4.1 3.5 - 5.1 mEq/L   Chloride 100 96 - 112 mEq/L   CO2 28 19 - 32 mEq/L   Glucose, Bld 102 (H) 70 - 99 mg/dL   BUN 13 6 - 23 mg/dL   Creatinine, Ser 1.21 0.40 - 1.50 mg/dL   Total Bilirubin 0.5 0.2 - 1.2 mg/dL   Alkaline Phosphatase 58 39 - 117 U/L   AST 11 0 - 37 U/L   ALT 39 0 - 53 U/L   Total Protein 7.2 6.0 - 8.3 g/dL   Albumin 4.5 3.5 - 5.2 g/dL   GFR 73.05 >60.00 mL/min   Calcium 9.9 8.4 - 10.5 mg/dL  Lipase  Result Value Ref Range   Lipase 32.0 11.0 - 59.0 U/L  POCT Urinalysis Dipstick (Automated)  Result Value Ref Range   Color, UA Yellow    Clarity, UA Clear    Glucose, UA Negative Negative   Bilirubin, UA Negative    Ketones, UA Negative    Spec Grav, UA 1.015 1.010 - 1.025   Blood, UA Negative    pH, UA 7.0 5.0 - 8.0   Protein, UA Negative  Negative   Urobilinogen, UA 0.2 0.2 or 1.0 E.U./dL   Nitrite, UA Negative    Leukocytes, UA Trace (A) Negative     Assessment and Plan  Right otitis media with effusion Assessment & Plan: Acute, possibly associated with right sinus infection.  Will treat with amoxicillin 500 mg 2 tabs twice daily for 10 days.   Continue nasal saline spray daily, add Flonase 2 sprays per nostril daily to help with drainage of effusion. No evidence of eardrum rupture. Return and ER precautions provided.   Other orders -     Amoxicillin; Take 2 capsules (1,000 mg total) by mouth 2 (two) times daily.  Dispense: 40 capsule; Refill: 0    No follow-ups on file.   Eliezer Lofts, MD

## 2022-09-22 NOTE — Patient Instructions (Addendum)
Complete  antibiotics. Continue nasal saline irrigation. Add flonase 2 sprays per nostril daily.  Call if not improving as expected.

## 2022-09-26 ENCOUNTER — Other Ambulatory Visit: Payer: Self-pay | Admitting: Family Medicine

## 2022-09-26 DIAGNOSIS — E291 Testicular hypofunction: Secondary | ICD-10-CM

## 2022-09-26 NOTE — Telephone Encounter (Signed)
Last office visit 09/22/22 for right otitis media with effusion.  Last refilled 08/27/21 for 6 ml with no refills.  Last Testosterone level 11/18/21 with was normal at 540.99 ng/mL.  Next Appt: No future appointments.

## 2022-11-09 ENCOUNTER — Other Ambulatory Visit: Payer: Self-pay | Admitting: *Deleted

## 2022-11-09 NOTE — Telephone Encounter (Signed)
Last office visit 09/22/2022 for right otitis media with effusion.  Last refilled ?12/01/2021 for 30 g with no refills.  Next Appt: No future appointments.

## 2022-11-10 MED ORDER — TRIAMCINOLONE ACETONIDE 0.1 % EX CREA: 1.0000 | TOPICAL_CREAM | Freq: Two times a day (BID) | CUTANEOUS | 0 refills | Status: DC | PRN

## 2022-12-10 DIAGNOSIS — G4733 Obstructive sleep apnea (adult) (pediatric): Secondary | ICD-10-CM | POA: Diagnosis not present

## 2022-12-16 DIAGNOSIS — M25571 Pain in right ankle and joints of right foot: Secondary | ICD-10-CM | POA: Diagnosis not present

## 2022-12-22 ENCOUNTER — Ambulatory Visit: Payer: BC Managed Care – PPO | Admitting: Podiatry

## 2022-12-22 DIAGNOSIS — Q667 Congenital pes cavus, unspecified foot: Secondary | ICD-10-CM | POA: Diagnosis not present

## 2022-12-22 DIAGNOSIS — M722 Plantar fascial fibromatosis: Secondary | ICD-10-CM

## 2022-12-22 NOTE — Progress Notes (Signed)
Subjective:  Patient ID: Carl Roberts, male    DOB: March 25, 1976,  MRN: [id number]  Chief Complaint  Patient presents with   Foot Orthotics    47 y.o. male presents with the above complaint.  Patient presents with right heel pain.  Patient states painful to touch is progressive gotten worse worse with ambulation or shoe pressure he states that his previous pair of orthotics are also getting worn out.  He has been taking oral medication from another orthopedic group.  He does not want to do injection he denies seeing anyone as prior to seeing me he would like to get a new pair of orthotics as well.   Review of Systems: Negative except as noted in the HPI. Denies N/V/F/Ch.  Past Medical History:  Diagnosis Date   Asthma    Hyperlipidemia    Hypertension    Low testosterone    Pituitary adenoma (HCC) 10/26/2011   Overview:  Last MRI normal 10/2011   Stomach ulcer     Current Outpatient Medications:    amLODipine (NORVASC) 5 MG tablet, Take 1 tablet (5 mg total) by mouth daily., Disp: 30 tablet, Rfl: 11   amoxicillin (AMOXIL) 500 MG capsule, Take 2 capsules (1,000 mg total) by mouth 2 (two) times daily., Disp: 40 capsule, Rfl: 0   lisinopril-hydrochlorothiazide (ZESTORETIC) 20-12.5 MG tablet, Take 2 tablets by mouth daily., Disp: 180 tablet, Rfl: 1   NEEDLE, DISP, 25 G (BD HYPODERMIC NEEDLE) 25G X 1-1/2" MISC, USE 1 EACH EVERY 14 (FOURTEEN) DAYS (FOR ADMINISTERING THE MEDICATION), Disp: 50 each, Rfl: 3   testosterone cypionate (DEPOTESTOSTERONE CYPIONATE) 200 MG/ML injection, INJECT 0.5 MLS (100 MG TOTAL) INTO THE MUSCLE ONCE A WEEK., Disp: 4 mL, Rfl: 1   triamcinolone cream (KENALOG) 0.1 %, Apply 1 Application topically 2 (two) times daily as needed., Disp: 30 g, Rfl: 0  Social History   Tobacco Use  Smoking Status Former   Packs/day: 0.50   Years: 4.00   Additional pack years: 0.00   Total pack years: 2.00   Types: Cigarettes, Pipe   Quit date: 08/03/1998   Years since  quitting: 24.4  Smokeless Tobacco Never    Allergies  Allergen Reactions   Naproxen Shortness Of Breath   Shellfish Allergy Other (See Comments)   Objective:  There were no vitals filed for this visit. There is no height or weight on file to calculate BMI. Constitutional Well developed. Well nourished.  Vascular Dorsalis pedis pulses palpable bilaterally. Posterior tibial pulses palpable bilaterally. Capillary refill normal to all digits.  No cyanosis or clubbing noted. Pedal hair growth normal.  Neurologic Normal speech. Oriented to person, place, and time. Epicritic sensation to light touch grossly present bilaterally.  Dermatologic Nails well groomed and normal in appearance. No open wounds. No skin lesions.  Orthopedic: Normal joint ROM without pain or crepitus bilaterally. No visible deformities. Tender to palpation at the calcaneal tuber right. No pain with calcaneal squeeze right. Ankle ROM diminished range of motion right. Silfverskiold Test: positive right.   Radiographs: None  Assessment:   1. Plantar fasciitis of right foot   2. Pes cavus    Plan:  Patient was evaluated and treated and all questions answered.  Plantar Fasciitis, right - XR reviewed as above.  - Educated on icing and stretching. Instructions given.  -Patient would like to hold off on injection if there is no improvement we will discuss injection next time - DME: Plantar fascial brace dispensed to support the medial longitudinal arch  of the foot and offload pressure from the heel and prevent arch collapse during weightbearing - Pharmacologic management: None   Pes cavus -I explained to patient the etiology of pes cavus and relationship with Planter fasciitis and various treatment options were discussed.  Given patient foot structure in the setting of Planter fasciitis I believe patient will benefit from custom-made orthotics to help control the hindfoot motion support the arch of the foot  and take the stress away from plantar fascial.  Patient agrees with the plan like to proceed with orthotics -Patient was casted for orthotics

## 2022-12-29 DIAGNOSIS — M722 Plantar fascial fibromatosis: Secondary | ICD-10-CM | POA: Diagnosis not present

## 2023-02-02 DIAGNOSIS — M722 Plantar fascial fibromatosis: Secondary | ICD-10-CM | POA: Diagnosis not present

## 2023-02-05 ENCOUNTER — Ambulatory Visit (INDEPENDENT_AMBULATORY_CARE_PROVIDER_SITE_OTHER): Payer: BC Managed Care – PPO | Admitting: Podiatry

## 2023-02-05 DIAGNOSIS — M722 Plantar fascial fibromatosis: Secondary | ICD-10-CM

## 2023-02-05 NOTE — Progress Notes (Signed)
Patient presents today to pick up custom orthotics   Patient was dispensed 1 pair of custom orthotics  Fit was satisfactory. Instructions for break-in and wear was reviewed and a copy was given to the patient.    

## 2023-02-26 ENCOUNTER — Telehealth: Payer: Self-pay | Admitting: Family Medicine

## 2023-02-26 NOTE — Telephone Encounter (Signed)
When you have the blood pressure issue we recommend against decongestants.  I am pretty sure that this medication has a decongestant.  I would suggest instead Tylenol by itself, Mucinex DM for cough and congestion.  Coricidin HBP is okay for sinus congestion as well.

## 2023-02-26 NOTE — Telephone Encounter (Signed)
Patient would like to know if it would be okay for him to take tylenol headache and sinus with his b/p medications amLODipine (NORVASC) 5 MG tablet and  lisinopril-hydrochlorothiazide (ZESTORETIC) 20-12.5 MG tablet. Please advise.

## 2023-02-26 NOTE — Telephone Encounter (Signed)
Carl Roberts notified as instructed by telephone.  Patient states understanding.

## 2023-03-03 DIAGNOSIS — Z713 Dietary counseling and surveillance: Secondary | ICD-10-CM | POA: Diagnosis not present

## 2023-04-01 ENCOUNTER — Other Ambulatory Visit: Payer: Self-pay | Admitting: Family Medicine

## 2023-04-01 DIAGNOSIS — E291 Testicular hypofunction: Secondary | ICD-10-CM

## 2023-04-01 NOTE — Telephone Encounter (Signed)
Patient scheduled.

## 2023-04-01 NOTE — Telephone Encounter (Signed)
Please call and schedule CPE with fasting labs prior with Dr. Ermalene Searing.  Once schedule, please send back to me.

## 2023-04-01 NOTE — Telephone Encounter (Signed)
Last office visit 09/22/2022 for Right Otitis Media with Effusion.  Last refilled 09/29/2022 for 4 ml with 1 refill.  CPE scheduled 04/19/2023.

## 2023-04-12 ENCOUNTER — Other Ambulatory Visit: Payer: BC Managed Care – PPO

## 2023-04-13 ENCOUNTER — Encounter (HOSPITAL_COMMUNITY): Payer: Self-pay

## 2023-04-13 ENCOUNTER — Emergency Department (HOSPITAL_COMMUNITY): Payer: BC Managed Care – PPO

## 2023-04-13 ENCOUNTER — Ambulatory Visit
Admission: EM | Admit: 2023-04-13 | Discharge: 2023-04-13 | Disposition: A | Discharge status: Home / Self Care | Payer: BC Managed Care – PPO

## 2023-04-13 ENCOUNTER — Emergency Department (HOSPITAL_COMMUNITY)
Admission: EM | Admit: 2023-04-13 | Discharge: 2023-04-13 | Disposition: A | Discharge status: Home / Self Care | Payer: BC Managed Care – PPO | Attending: Emergency Medicine | Admitting: Emergency Medicine

## 2023-04-13 ENCOUNTER — Other Ambulatory Visit: Payer: Self-pay

## 2023-04-13 DIAGNOSIS — Z79899 Other long term (current) drug therapy: Secondary | ICD-10-CM | POA: Diagnosis not present

## 2023-04-13 DIAGNOSIS — N200 Calculus of kidney: Secondary | ICD-10-CM | POA: Diagnosis not present

## 2023-04-13 DIAGNOSIS — J45909 Unspecified asthma, uncomplicated: Secondary | ICD-10-CM | POA: Insufficient documentation

## 2023-04-13 DIAGNOSIS — I10 Essential (primary) hypertension: Secondary | ICD-10-CM | POA: Insufficient documentation

## 2023-04-13 DIAGNOSIS — M549 Dorsalgia, unspecified: Secondary | ICD-10-CM | POA: Diagnosis not present

## 2023-04-13 DIAGNOSIS — K760 Fatty (change of) liver, not elsewhere classified: Secondary | ICD-10-CM | POA: Diagnosis not present

## 2023-04-13 DIAGNOSIS — R0789 Other chest pain: Secondary | ICD-10-CM | POA: Diagnosis not present

## 2023-04-13 DIAGNOSIS — R202 Paresthesia of skin: Secondary | ICD-10-CM | POA: Diagnosis not present

## 2023-04-13 DIAGNOSIS — R918 Other nonspecific abnormal finding of lung field: Secondary | ICD-10-CM | POA: Diagnosis not present

## 2023-04-13 DIAGNOSIS — R079 Chest pain, unspecified: Secondary | ICD-10-CM

## 2023-04-13 DIAGNOSIS — E042 Nontoxic multinodular goiter: Secondary | ICD-10-CM | POA: Diagnosis not present

## 2023-04-13 LAB — COMPREHENSIVE METABOLIC PANEL
ALT: 45 U/L — ABNORMAL HIGH (ref 0–44)
AST: 21 U/L (ref 15–41)
Albumin: 4.6 g/dL (ref 3.5–5.0)
Alkaline Phosphatase: 64 U/L (ref 38–126)
Anion gap: 14 (ref 5–15)
BUN: 14 mg/dL (ref 6–20)
CO2: 21 mmol/L — ABNORMAL LOW (ref 22–32)
Calcium: 9.9 mg/dL (ref 8.9–10.3)
Chloride: 98 mmol/L (ref 98–111)
Creatinine, Ser: 1.02 mg/dL (ref 0.61–1.24)
GFR, Estimated: >60 mL/min (ref >60)
Glucose, Bld: 135 mg/dL — ABNORMAL HIGH (ref 70–99)
Potassium: 3.4 mmol/L — ABNORMAL LOW (ref 3.5–5.1)
Sodium: 133 mmol/L — ABNORMAL LOW (ref 135–145)
Total Bilirubin: 1.1 mg/dL (ref 0.3–1.2)
Total Protein: 8.1 g/dL (ref 6.5–8.1)

## 2023-04-13 LAB — CBC WITH DIFFERENTIAL/PLATELET
Abs Immature Granulocytes: 0.04 10*3/uL (ref 0.00–0.07)
Basophils Absolute: 0.1 10*3/uL (ref 0.0–0.1)
Basophils Relative: 1 %
Eosinophils Absolute: 0.2 10*3/uL (ref 0.0–0.5)
Eosinophils Relative: 2 %
HCT: 48.8 % (ref 39.0–52.0)
Hemoglobin: 16.3 g/dL (ref 13.0–17.0)
Immature Granulocytes: 0 %
Lymphocytes Relative: 25 %
Lymphs Abs: 2.3 10*3/uL (ref 0.7–4.0)
MCH: 31.5 pg (ref 26.0–34.0)
MCHC: 33.4 g/dL (ref 30.0–36.0)
MCV: 94.2 fL (ref 80.0–100.0)
Monocytes Absolute: 0.6 10*3/uL (ref 0.1–1.0)
Monocytes Relative: 6 %
Neutro Abs: 6.1 10*3/uL (ref 1.7–7.7)
Neutrophils Relative %: 66 %
Platelets: 252 10*3/uL (ref 150–400)
RBC: 5.18 MIL/uL (ref 4.22–5.81)
RDW: 13.1 % (ref 11.5–15.5)
WBC: 9.3 10*3/uL (ref 4.0–10.5)
nRBC: 0 % (ref 0.0–0.2)

## 2023-04-13 LAB — TROPONIN I (HIGH SENSITIVITY)
Troponin I (High Sensitivity): 6 ng/L (ref <18)
Troponin I (High Sensitivity): 6 ng/L (ref <18)

## 2023-04-13 MED ORDER — FAMOTIDINE IN NACL 20-0.9 MG/50ML-% IV SOLN
20.0000 mg | Freq: Once | INTRAVENOUS | Status: AC
  Administered 2023-04-13: 20 mg via INTRAVENOUS
  Filled 2023-04-13: qty 50

## 2023-04-13 MED ORDER — METHYLPREDNISOLONE SODIUM SUCC 125 MG IJ SOLR
125.0000 mg | Freq: Once | INTRAMUSCULAR | Status: AC
  Administered 2023-04-13: 125 mg via INTRAVENOUS
  Filled 2023-04-13: qty 2

## 2023-04-13 MED ORDER — IOHEXOL 350 MG/ML SOLN
100.0000 mL | Freq: Once | INTRAVENOUS | Status: AC | PRN
  Administered 2023-04-13: 100 mL via INTRAVENOUS

## 2023-04-13 MED ORDER — DIPHENHYDRAMINE HCL 50 MG/ML IJ SOLN
25.0000 mg | Freq: Once | INTRAMUSCULAR | Status: AC
  Administered 2023-04-13: 25 mg via INTRAVENOUS
  Filled 2023-04-13: qty 1

## 2023-04-13 NOTE — ED Triage Notes (Signed)
"  I am having some pain between my shoulder in my back with radiation of paint to left arm and chest". Attempted to take BP at work at it was "hight" (faulty cuff). Followed by PCP. No sob. No Nausea. "Left arm feels asleep".

## 2023-04-13 NOTE — ED Triage Notes (Signed)
Patient sent by UC. Patient reports left sided chest pain that radiates to his shoulder starting this AM. Patient also reports pins and needles going down the left arm. Patient denies SOB.  VSS, NAD

## 2023-04-13 NOTE — ED Provider Notes (Signed)
Carl Roberts Provider Note   CSN: [id number] Arrival date & time: 04/13/23  1448     History {Add pertinent medical, surgical, social history, OB history to HPI:1} Chief Complaint  Patient presents with   Chest Pain    Carl Roberts is a 47 y.o. male.   Chest Pain 47 year old male history of asthma, hypertension, hyperlipidemia presenting for back pain.  Patient works a hard job.  Physical exertion of more than normal today.  At some point today he developed pain to his left side of his back below his shoulder blade and seems to radiate have some numbness to his left hand and arm.  It is not worse with movement, not worse with exertion.  No frank chest pain.  Went to urgent care and they sent him here.  No history of ACS or stroke.  He had a stress test years ago which was reportedly normal per patient.  No prior heart cath or stent.  Smokes cigars occasionally.     Home Medications Prior to Admission medications   Medication Sig Start Date End Date Taking? Authorizing Provider  amLODipine (NORVASC) 5 MG tablet Take 1 tablet (5 mg total) by mouth daily. 05/13/22   Bedsole, [name], MD  amoxicillin (AMOXIL) 500 MG capsule Take 2 capsules (1,000 mg total) by mouth 2 (two) times daily. 09/22/22   Bedsole, [name], MD  lisinopril-hydrochlorothiazide (ZESTORETIC) 20-12.5 MG tablet Take 2 tablets by mouth daily. 08/28/22   Bedsole, [name], MD  meloxicam (MOBIC) 15 MG tablet Take 15 mg by mouth daily as needed. 12/16/22   [provider]  NEEDLE, DISP, 25 G (BD HYPODERMIC NEEDLE) 25G X 1-1/2" MISC USE 1 EACH EVERY 14 (FOURTEEN) DAYS (FOR ADMINISTERING THE MEDICATION) 11/30/19   Gweneth Dimitri, MD  testosterone cypionate (DEPOTESTOSTERONE CYPIONATE) 200 MG/ML injection INJECT 0.5 MLS (100 MG TOTAL) INTO THE MUSCLE ONCE A WEEK. (SINGLE USE VIALS) 04/01/23   Worthy Rancher B, FNP  triamcinolone cream (KENALOG) 0.1 % Apply 1 Application topically 2  (two) times daily as needed. 11/10/22   Excell Seltzer, MD      Allergies    Naproxen and Shellfish allergy    Review of Systems   Review of Systems  Cardiovascular:  Positive for chest pain.  Review of systems completed and notable as per HPI.  ROS otherwise negative.   Physical Exam Updated Vital Signs BP (!) 157/107   Pulse 92   Temp 97.8 F (36.6 C) (Oral)   Resp 18   Ht 6\' 1"  (1.854 m)   Wt (!) 145.6 kg   SpO2 99%   BMI 42.35 kg/m  Physical Exam Vitals and nursing note reviewed.  Constitutional:      General: He is not in acute distress.    Appearance: He is well-developed.  HENT:     Head: Normocephalic and atraumatic.     Mouth/Throat:     Mouth: Mucous membranes are moist.     Pharynx: Oropharynx is clear.  Eyes:     Extraocular Movements: Extraocular movements intact.     Conjunctiva/sclera: Conjunctivae normal.     Pupils: Pupils are equal, round, and reactive to light.  Cardiovascular:     Rate and Rhythm: Normal rate and regular rhythm.     Pulses: Normal pulses.     Heart sounds: Normal heart sounds. No murmur heard. Pulmonary:     Effort: Pulmonary effort is normal. No respiratory distress.  Breath sounds: Normal breath sounds.  Chest:     Chest wall: No tenderness.  Abdominal:     Palpations: Abdomen is soft.     Tenderness: There is no abdominal tenderness.  Musculoskeletal:        General: No swelling.     Cervical back: Neck supple.     Right lower leg: No edema.     Left lower leg: No edema.  Skin:    General: Skin is warm and dry.     Capillary Refill: Capillary refill takes less than 2 seconds.  Neurological:     General: No focal deficit present.     Mental Status: He is alert and oriented to person, place, and time. Mental status is at baseline.  Psychiatric:        Mood and Affect: Mood normal.     ED Results / Procedures / Treatments   Labs (all labs ordered are listed, but only abnormal results are displayed) Labs  Reviewed - No data to display  EKG None  Radiology No results found.  Procedures Procedures  {Document cardiac monitor, telemetry assessment procedure when appropriate:1}  Medications Ordered in ED Medications - No data to display  ED Course/ Medical Decision Making/ A&P   {   Click here for ABCD2, HEART and other calculatorsREFRESH Note before signing :1}                              Medical Decision Making Amount and/or Complexity of Data Reviewed Labs: ordered. Radiology: ordered.   Medical Decision Making:   Carl Roberts is a 47 y.o. male who presented to the ED today with back pain rating to his left arm.  Vital signs reviewed.  On exam he is well-appearing.  EKG without acute ischemia.  I am concerned for possible ACS, will trend troponin he does have multiple risk factors for heart disease.  Consider dissection as well given primarily back pain rating to the arm, although has good pulses.  Will obtain CT of the chest.  Lower suspicion for PE given no shortness of breath or other recent risk factors for DVT or PE.  He thought it could be musculoskeletal, however pain is not worse with movement, not reproducible, and the pain is deeper on the left side of his back no midline pain.   {crccomplexity:27900} Reviewed and confirmed nursing documentation for past medical history, family history, social history.  Initial Study Results:   Laboratory  All laboratory results reviewed.  Labs notable for ***  ***EKG EKG was reviewed independently. Rate, rhythm, axis, intervals all examined and without medically relevant abnormality. ST segments without concerns for elevations.    Radiology:  All images reviewed independently. ***Agree with radiology report at this time.      Consults: Case discussed with ***.   Reassessment and Plan:   ***    Patient's presentation is most consistent with {EM COPA:27473}     {Document critical care time when  appropriate:1} {Document review of labs and clinical decision tools ie heart score, Chads2Vasc2 etc:1}  {Document your independent review of radiology images, and any outside records:1} {Document your discussion with family members, caretakers, and with consultants:1} {Document social determinants of health affecting pt's care:1} {Document your decision making why or why not admission, treatments were needed:1} Final Clinical Impression(s) / ED Diagnoses Final diagnoses:  None    Rx / DC Orders ED Discharge Orders  None

## 2023-04-13 NOTE — ED Provider Notes (Signed)
Patient here today for evaluation of left-sided posterior upper back pain around shoulder area that radiates into chest and down left arm.  He notes that he feels as if his left arm has been asleep.  He denies any shortness of breath.  He has not any nausea.  He does not report treatment for symptoms.  Recommended further evaluation in the emergency room.  Patient reports he would like to transport himself and feels stable to do so.   Tomi Bamberger, PA-C 04/13/23 1424

## 2023-04-13 NOTE — Discharge Instructions (Signed)
Cardiology will call you to schedule follow up.  Follow up with your PCP as well.  If you develop chest pain, trouble breathing, or any other new concerning symptoms you should return to the ED.

## 2023-04-15 ENCOUNTER — Other Ambulatory Visit: Payer: BC Managed Care – PPO

## 2023-04-19 ENCOUNTER — Encounter: Payer: BC Managed Care – PPO | Admitting: Family Medicine

## 2023-04-22 ENCOUNTER — Encounter: Payer: BC Managed Care – PPO | Admitting: Family Medicine

## 2023-04-23 ENCOUNTER — Other Ambulatory Visit: Payer: Self-pay | Admitting: Family Medicine

## 2023-04-23 ENCOUNTER — Encounter: Payer: Self-pay | Admitting: Family Medicine

## 2023-04-23 DIAGNOSIS — I10 Essential (primary) hypertension: Secondary | ICD-10-CM

## 2023-04-26 NOTE — Telephone Encounter (Signed)
Ok to refill looks like it was d/c a few times.

## 2023-05-05 NOTE — Progress Notes (Deleted)
Francee Gentile MD Reason for referral-Chest pain  HPI: 47 yo male for evaluation of chest pain at request of Fulton Reek MD.  LDL 3/23 172. 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 Seen with CP 04/13/23; CTA showed no dissection. Troponins normal. Hgb 16.3, Cr 1.02. ALT 45. Cardiology now asked to evaluate.  Current Outpatient Medications  Medication Sig Dispense Refill   amLODipine (NORVASC) 5 MG tablet Take 1 tablet (5 mg total) by mouth daily. 30 tablet 11   amoxicillin (AMOXIL) 500 MG capsule Take 2 capsules (1,000 mg total) by mouth 2 (two) times daily. 40 capsule 0   lisinopril-hydrochlorothiazide (ZESTORETIC) 20-12.5 MG tablet TAKE 2 TABLETS BY MOUTH EVERY DAY 60 tablet 0   meloxicam (MOBIC) 15 MG tablet Take 15 mg by mouth daily as needed.     NEEDLE, DISP, 25 G (BD HYPODERMIC NEEDLE) 25G X 1-1/2" MISC USE 1 EACH EVERY 14 (FOURTEEN) DAYS (FOR ADMINISTERING THE MEDICATION) 50 each 3   testosterone cypionate (DEPOTESTOSTERONE CYPIONATE) 200 MG/ML injection INJECT 0.5 MLS (100 MG TOTAL) INTO THE MUSCLE ONCE A WEEK. (SINGLE USE VIALS) 4 mL 1   triamcinolone cream (KENALOG) 0.1 % APPLY 1 APPLICATION TOPICALLY TWICE A DAY AS NEEDED 30 g 0   No current facility-administered medications for this visit.    Allergies  Allergen Reactions   Naproxen Shortness Of Breath   Shellfish Allergy Other (See Comments)     Past Medical History:  Diagnosis Date   Asthma    Hyperlipidemia    Hypertension    Low testosterone    Pituitary adenoma (HCC) 10/26/2011   Overview:  Last MRI normal 10/2011   Stomach ulcer     Past Surgical History:  Procedure Laterality Date   BACK SURGERY      Social History   Socioeconomic History   Marital status: Married    Spouse name: Megan   Number of children: 2   Years of education: high school, some college   Highest education level: Not on file   Occupational History   Not on file  Tobacco Use   Smoking status: Former    Current packs/day: 0.00    Average packs/day: 0.5 packs/day for 4.0 years (2.0 ttl pk-yrs)    Types: Cigarettes, Pipe    Start date: 08/03/1994    Quit date: 08/03/1998    Years since quitting: 24.7   Smokeless tobacco: Never  Vaping Use   Vaping status: Never Used  Substance and Sexual Activity   Alcohol use: Yes    Comment: 1 glass of wine, liquor on the weekends   Drug use: No   Sexual activity: Yes    Birth control/protection: None  Other Topics Concern   Not on file  Social History Narrative   Lives with wife, Aundra Millet   2 children   Exercise: tries to get 3 days a week, was running 2 times a week   Social Determinants of Health   Financial Resource Strain: Low Risk  (08/03/2018)   Overall Financial Resource Strain (CARDIA)    Difficulty of Paying Living Expenses: Not hard at all  Food Insecurity: No Food Insecurity (09/06/2020)   Received from [facility], [facility]   Hunger Vital Sign    Worried About Running Out of Food in the Last Year: Never true    Ran Out of Food in the Last Year: Never true  Transportation Needs: Not on file  Physical Activity: Not on file  Stress: Not on file  Social Connections: Unknown (11/17/2021)   Received from [facility], [facility]   Social Network    Social Network: Not on file  Intimate Partner Violence: Unknown (10/23/2021)   Received from [facility], [facility]   HITS    Physically Hurt: Not on file    Insult or Talk Down To: Not on file    Threaten Physical Harm: Not on file    Scream or Curse: Not on file    Family History  Problem Relation Age of Onset   Arthritis Mother    Diabetes Mother    Arthritis Father    Glaucoma Father    Asthma Maternal Grandmother    Lung cancer Maternal Grandmother    COPD Maternal Grandfather    Heart disease Maternal Grandfather    Hyperlipidemia Maternal Grandfather    Heart attack Maternal  Grandfather 93   Alzheimer's disease Paternal Grandmother    Parkinson's disease Paternal Grandfather    Heart attack Paternal Grandfather 39   Allergies Son    Hypertension Brother    Hyperlipidemia Brother     ROS: no fevers or chills, productive cough, hemoptysis, dysphasia, odynophagia, melena, hematochezia, dysuria, hematuria, rash, seizure activity, orthopnea, PND, pedal edema, claudication. Remaining systems are negative.  Physical Exam:   There were no vitals taken for this visit.  General:  Well developed/well nourished in NAD Skin warm/dry Patient not depressed No peripheral clubbing Back-normal HEENT-normal/normal eyelids Neck supple/normal carotid upstroke bilaterally; no bruits; no JVD; no thyromegaly chest - CTA/ normal expansion CV - RRR/normal S1 and S2; no murmurs, rubs or gallops;  PMI nondisplaced Abdomen -NT/ND, no HSM, no mass, + bowel sounds, no bruit 2+ femoral pulses, no bruits Ext-no edema, chords, 2+ DP Neuro-grossly nonfocal  ECG - 04/13/23; normal sinus rhythm with no ST changes. personally reviewed  A/P  1 chest pain-  2 Coronary calcification-  3 Hypertension-  4 Hyperlipidemia-  Olga Millers, MD

## 2023-05-18 ENCOUNTER — Ambulatory Visit: Payer: BC Managed Care – PPO | Admitting: Cardiology

## 2023-05-21 ENCOUNTER — Other Ambulatory Visit: Payer: Self-pay | Admitting: Family Medicine

## 2023-05-21 DIAGNOSIS — I10 Essential (primary) hypertension: Secondary | ICD-10-CM

## 2023-05-21 NOTE — Telephone Encounter (Signed)
Voicemail Full.Sent Mychart message to patient.

## 2023-05-21 NOTE — Telephone Encounter (Signed)
Please call and schedule CPE or follow up HTN appointment with Dr. Ermalene Searing with fasting labs prior. . Please send back to me once scheduled to refill medication.

## 2023-05-24 NOTE — Telephone Encounter (Signed)
Vm box is full.

## 2023-05-26 ENCOUNTER — Other Ambulatory Visit: Payer: Self-pay | Admitting: Family

## 2023-05-26 DIAGNOSIS — E291 Testicular hypofunction: Secondary | ICD-10-CM

## 2023-06-07 ENCOUNTER — Other Ambulatory Visit: Payer: Self-pay | Admitting: Family Medicine

## 2023-06-07 DIAGNOSIS — I10 Essential (primary) hypertension: Secondary | ICD-10-CM

## 2023-06-10 ENCOUNTER — Telehealth: Payer: Self-pay | Admitting: *Deleted

## 2023-06-10 DIAGNOSIS — E782 Mixed hyperlipidemia: Secondary | ICD-10-CM

## 2023-06-10 DIAGNOSIS — E559 Vitamin D deficiency, unspecified: Secondary | ICD-10-CM

## 2023-06-10 DIAGNOSIS — Z1159 Encounter for screening for other viral diseases: Secondary | ICD-10-CM

## 2023-06-10 DIAGNOSIS — E291 Testicular hypofunction: Secondary | ICD-10-CM

## 2023-06-10 NOTE — Telephone Encounter (Signed)
-----   Message from Alvina Chou sent at 06/10/2023 12:47 PM EST ----- Regarding: Lab orders for [facility], 12.5.24 Patient is scheduled for CPX labs, please order future labs, Thanks , Camelia Eng

## 2023-06-24 ENCOUNTER — Other Ambulatory Visit (INDEPENDENT_AMBULATORY_CARE_PROVIDER_SITE_OTHER): Payer: BC Managed Care – PPO

## 2023-06-24 DIAGNOSIS — E782 Mixed hyperlipidemia: Secondary | ICD-10-CM

## 2023-06-24 DIAGNOSIS — E559 Vitamin D deficiency, unspecified: Secondary | ICD-10-CM

## 2023-06-24 DIAGNOSIS — E291 Testicular hypofunction: Secondary | ICD-10-CM | POA: Diagnosis not present

## 2023-06-24 DIAGNOSIS — Z1159 Encounter for screening for other viral diseases: Secondary | ICD-10-CM

## 2023-06-24 LAB — COMPREHENSIVE METABOLIC PANEL
ALT: 57 U/L — ABNORMAL HIGH (ref 0–53)
AST: 15 U/L (ref 0–37)
Albumin: 4.6 g/dL (ref 3.5–5.2)
Alkaline Phosphatase: 70 U/L (ref 39–117)
BUN: 13 mg/dL (ref 6–23)
CO2: 27 meq/L (ref 19–32)
Calcium: 9.9 mg/dL (ref 8.4–10.5)
Chloride: 100 meq/L (ref 96–112)
Creatinine, Ser: 0.89 mg/dL (ref 0.40–1.50)
GFR: 103.39 mL/min (ref >60.00)
Glucose, Bld: 104 mg/dL — ABNORMAL HIGH (ref 70–99)
Potassium: 4.2 meq/L (ref 3.5–5.1)
Sodium: 136 meq/L (ref 135–145)
Total Bilirubin: 0.7 mg/dL (ref 0.2–1.2)
Total Protein: 7.4 g/dL (ref 6.0–8.3)

## 2023-06-24 LAB — VITAMIN D 25 HYDROXY (VIT D DEFICIENCY, FRACTURES): VITD: 16.88 ng/mL — ABNORMAL LOW (ref 30.00–100.00)

## 2023-06-24 LAB — LIPID PANEL
Cholesterol: 278 mg/dL — ABNORMAL HIGH (ref 0–200)
HDL: 39.4 mg/dL (ref >39.00)
Total CHOL/HDL Ratio: 7
Triglycerides: 451 mg/dL — ABNORMAL HIGH (ref 0.0–149.0)

## 2023-06-24 LAB — LDL CHOLESTEROL, DIRECT: Direct LDL: 168 mg/dL

## 2023-06-24 LAB — TESTOSTERONE: Testosterone: 358.4 ng/dL (ref 300.00–890.00)

## 2023-06-24 NOTE — Progress Notes (Signed)
No critical labs need to be addressed urgently. We will discuss labs in detail at upcoming office visit.   

## 2023-06-25 LAB — HEPATITIS C ANTIBODY: Hepatitis C Ab: NONREACTIVE

## 2023-06-25 NOTE — Progress Notes (Signed)
No critical labs need to be addressed urgently. We will discuss labs in detail at upcoming office visit.   

## 2023-06-28 NOTE — Progress Notes (Unsigned)
  Cardiology Office Note:  .   Date:  06/28/2023  ID:  Carl Roberts, DOB 06-Dec-1975, MRN [id number] PCP: Excell Seltzer, MD  [facility] Providers Cardiologist:  None { Click to update primary MD,subspecialty MD or APP then REFRESH:1}   History of Present Illness: .   Carl Roberts is a 47 y.o. male with history of CAC, HLD, HTN who presents for the evaluation of chest pain at the request of Excell Seltzer, MD. Seen in ER 9/24 for CP.      Problem List Coronary calcium  -CAC 63 (93rd percentile) HTN HLD -T chol 278, HDL 39, LDL 168, TG 451 Asthma     ROS: All other ROS reviewed and negative. Pertinent positives noted in the HPI.     Studies Reviewed: Marland Kitchen       Physical Exam:   VS:  There were no vitals taken for this visit.   Wt Readings from Last 3 Encounters:  04/13/23 (!) 321 lb (145.6 kg)  04/13/23 (!) 321 lb (145.6 kg)  09/22/22 (!) 327 lb 2 oz (148.4 kg)    GEN: Well nourished, well developed in no acute distress NECK: No JVD; No carotid bruits CARDIAC: ***RRR, no murmurs, rubs, gallops RESPIRATORY:  Clear to auscultation without rales, wheezing or rhonchi  ABDOMEN: Soft, non-tender, non-distended EXTREMITIES:  No edema; No deformity  ASSESSMENT AND PLAN: .   ***    {Are you ordering a CV Procedure (e.g. stress test, cath, DCCV, TEE, etc)?   Press F2        :[id number]}   Follow-up: No follow-ups on file.  Time Spent with Patient: I have spent a total of *** minutes caring for this patient today face to face, ordering and reviewing labs/tests, reviewing prior records/medical history, examining the patient, establishing an assessment and plan, communicating results/findings to the patient/family, and documenting in the medical record.   Signed, Lenna Gilford. Flora Lipps, MD, [facility]  [facility]  [address], [address] [place], [place] [address] [phone]  9:17 PM

## 2023-06-29 ENCOUNTER — Ambulatory Visit: Payer: BC Managed Care – PPO | Admitting: Cardiovascular Disease

## 2023-06-29 DIAGNOSIS — R072 Precordial pain: Secondary | ICD-10-CM

## 2023-06-29 DIAGNOSIS — E782 Mixed hyperlipidemia: Secondary | ICD-10-CM

## 2023-06-29 DIAGNOSIS — R931 Abnormal findings on diagnostic imaging of heart and coronary circulation: Secondary | ICD-10-CM

## 2023-07-01 ENCOUNTER — Ambulatory Visit: Payer: BC Managed Care – PPO | Admitting: Family Medicine

## 2023-07-01 VITALS — BP 134/84 | HR 88 | Temp 98.3°F | Ht 72.25 in | Wt 315.5 lb

## 2023-07-01 DIAGNOSIS — E782 Mixed hyperlipidemia: Secondary | ICD-10-CM | POA: Diagnosis not present

## 2023-07-01 DIAGNOSIS — R7303 Prediabetes: Secondary | ICD-10-CM

## 2023-07-01 DIAGNOSIS — Z1211 Encounter for screening for malignant neoplasm of colon: Secondary | ICD-10-CM

## 2023-07-01 DIAGNOSIS — Z0000 Encounter for general adult medical examination without abnormal findings: Secondary | ICD-10-CM | POA: Diagnosis not present

## 2023-07-01 DIAGNOSIS — E291 Testicular hypofunction: Secondary | ICD-10-CM | POA: Diagnosis not present

## 2023-07-01 DIAGNOSIS — I10 Essential (primary) hypertension: Secondary | ICD-10-CM | POA: Diagnosis not present

## 2023-07-01 DIAGNOSIS — E559 Vitamin D deficiency, unspecified: Secondary | ICD-10-CM

## 2023-07-01 LAB — POCT GLYCOSYLATED HEMOGLOBIN (HGB A1C): Hemoglobin A1C: 4.9 % (ref 4.0–5.6)

## 2023-07-01 MED ORDER — VITAMIN D3 1.25 MG (50000 UT) PO CAPS: 1.0000 | ORAL_CAPSULE | ORAL | 0 refills | Status: DC

## 2023-07-01 NOTE — Patient Instructions (Signed)
Work on low carb, low animal fat, low cholesterol diet.

## 2023-07-01 NOTE — Progress Notes (Signed)
Patient ID: Carl Roberts, male    DOB: 08-16-1975, 47 y.o.   MRN: [id number]  This visit was conducted in person.  BP 134/84   Pulse 88   Temp 98.3 F (36.8 C)   Ht 6' 0.25" (1.835 m)   Wt (!) 315 lb 8 oz (143.1 kg)   SpO2 96%   BMI 42.49 kg/m    CC:  Chief Complaint  Patient presents with   Annual Exam    Subjective:   HPI: Carl Roberts is a 47 y.o. male presenting on 07/01/2023 for Annual Exam  The patient presents for annual medicare wellness, complete physical and review of chronic health problems. He/She also has the following acute concerns today: none  Reviewed labs in detail with patient Vitamin D low at 16.8 Slight elevation of ALT at 57  Cholesterol: Elevated triglycerides at 451.   Hypertension:    Improved control on amlodipine 5 mg daily, lisinopril hydrochlorothiazide 20/12.5 mg 2 tablets p.o. daily BP Readings from Last 3 Encounters:  07/01/23 134/84  04/13/23 (!) 170/98  04/13/23 120/83  Just using medication without problems or lightheadedness:  Chest pain with exertion: none Edema:none Short of breath:none Average home BPs: 158-60/ 90 Other issues:   Morbid obesity: He has lost 6-10 pounds in the last 3 months  Has decreased carbd, less biscuits  Minimal exercise... has just started doing step challenges Wt Readings from Last 3 Encounters:  07/01/23 (!) 315 lb 8 oz (143.1 kg)  04/13/23 (!) 321 lb (145.6 kg)  04/13/23 (!) 321 lb (145.6 kg)   BP Readings from Last 3 Encounters:  07/01/23 134/84  04/13/23 (!) 170/98  04/13/23 120/83   Hypogonadism: On testosterone injections CBC April 13, 2023 hemoglobin 16.3 June 24, 2023 testosterone level 358    IMPRESSION: 1. Coronary calcium score of 63. Percentiles are not available for age <45, but would be 93rd percentile for age 23.  Relevant past medical, surgical, family and social history reviewed and updated as indicated. Interim medical history since our last visit  reviewed. Allergies and medications reviewed and updated. Outpatient Medications Prior to Visit  Medication Sig Dispense Refill   amLODipine (NORVASC) 5 MG tablet Take 1 tablet (5 mg total) by mouth daily. 30 tablet 11   lisinopril-hydrochlorothiazide (ZESTORETIC) 20-12.5 MG tablet TAKE 2 TABLETS BY MOUTH EVERY DAY 60 tablet 0   NEEDLE, DISP, 25 G (BD HYPODERMIC NEEDLE) 25G X 1-1/2" MISC USE 1 EACH EVERY 14 (FOURTEEN) DAYS (FOR ADMINISTERING THE MEDICATION) 50 each 3   testosterone cypionate (DEPOTESTOSTERONE CYPIONATE) 200 MG/ML injection INJECT 0.5 MLS (100 MG TOTAL) INTO THE MUSCLE ONCE A WEEK. (SINGLE USE VIALS) 4 mL 1   triamcinolone cream (KENALOG) 0.1 % APPLY 1 APPLICATION TOPICALLY TWICE A DAY AS NEEDED 30 g 0   amoxicillin (AMOXIL) 500 MG capsule Take 2 capsules (1,000 mg total) by mouth 2 (two) times daily. 40 capsule 0   meloxicam (MOBIC) 15 MG tablet Take 15 mg by mouth daily as needed.     No facility-administered medications prior to visit.     Per HPI unless specifically indicated in ROS section below Review of Systems  Constitutional:  Negative for fatigue and fever.  HENT:  Negative for ear pain.   Eyes:  Negative for pain.  Respiratory:  Negative for cough and shortness of breath.   Cardiovascular:  Negative for chest pain, palpitations and leg swelling.  Gastrointestinal:  Negative for abdominal pain.  Genitourinary:  Negative for dysuria.  Musculoskeletal:  Negative for arthralgias.  Neurological:  Negative for syncope, light-headedness and headaches.  Psychiatric/Behavioral:  Negative for dysphoric mood.    Objective:  BP 134/84   Pulse 88   Temp 98.3 F (36.8 C)   Ht 6' 0.25" (1.835 m)   Wt (!) 315 lb 8 oz (143.1 kg)   SpO2 96%   BMI 42.49 kg/m   Wt Readings from Last 3 Encounters:  07/01/23 (!) 315 lb 8 oz (143.1 kg)  04/13/23 (!) 321 lb (145.6 kg)  04/13/23 (!) 321 lb (145.6 kg)      Physical Exam Constitutional:      Appearance: He is  well-developed. He is obese.  HENT:     Head: Normocephalic.     Right Ear: Hearing normal.     Left Ear: Hearing normal.     Nose: Nose normal.  Neck:     Thyroid: No thyroid mass or thyromegaly.     Vascular: No carotid bruit.     Trachea: Trachea normal.  Cardiovascular:     Rate and Rhythm: Normal rate and regular rhythm.     Pulses: Normal pulses.     Heart sounds: Heart sounds not distant. No murmur heard.    No friction rub. No gallop.     Comments: No peripheral edema Pulmonary:     Effort: Pulmonary effort is normal. No respiratory distress.     Breath sounds: Normal breath sounds.  Skin:    General: Skin is warm and dry.     Findings: No rash.  Psychiatric:        Speech: Speech normal.        Behavior: Behavior normal.        Thought Content: Thought content normal.       Results for orders placed or performed in visit on 07/01/23  POCT glycosylated hemoglobin (Hb A1C)   Collection Time: 07/01/23  4:33 PM  Result Value Ref Range   Hemoglobin A1C 4.9 4.0 - 5.6 %   HbA1c POC (<> result, manual entry)     HbA1c, POC (prediabetic range)     HbA1c, POC (controlled diabetic range)       COVID 19 screen:  No recent travel or known exposure to COVID19 The patient denies respiratory symptoms of COVID 19 at this time. The importance of social distancing was discussed today.   Assessment and Plan   Vaccines: COVID x 2, due for flu shot, up-to-date with tetanus Prostate Cancer Screen: No family history of early prostate cancer Colon Cancer Screen: Due      Smoking Status: Former ETOH/ drug use:  2-3 per day/none  Hep C: Done   HIV screen: Done  Problem List Items Addressed This Visit     Essential hypertension    Chronic, Improved control on amlodipine 5 mg daily, lisinopril hydrochlorothiazide 20/12.5 mg 2 tablets p.o. daily      Hyperlipidemia, mixed    Working oin lifestyle changes.. Recommended statin medication, patient will consider. 1Coronary  calcium score of 63. Percentiles are not available for age <45, but would be 93rd percentile for age 45.      Hypogonadism in male    On testosterone  100 mg weekly. [facility] April 13, 2023 hemoglobin 16.3 June 24, 2023 testosterone level 358      Morbid obesity [facility])   Reviewed lifestyle  changes and  associated risks including CVD.      Vitamin D deficiency   Other Visit Diagnoses  Routine general medical examination at a health care facility    -  Primary     Prediabetes       Relevant Orders   POCT glycosylated hemoglobin (Hb A1C) (Completed)     Colon cancer screening       Relevant Orders   Ambulatory referral to Gastroenterology          Kerby Nora, MD

## 2023-07-05 ENCOUNTER — Other Ambulatory Visit: Payer: BC Managed Care – PPO

## 2023-07-13 NOTE — Assessment & Plan Note (Signed)
Chronic, Improved control on amlodipine 5 mg daily, lisinopril hydrochlorothiazide 20/12.5 mg 2 tablets p.o. daily

## 2023-07-13 NOTE — Assessment & Plan Note (Signed)
On testosterone  100 mg weekly. [facility] April 13, 2023 hemoglobin 16.3 June 24, 2023 testosterone level 358

## 2023-07-13 NOTE — Assessment & Plan Note (Signed)
Working oin lifestyle changes.. Recommended statin medication, patient will consider. 1Coronary calcium score of 63. Percentiles are not available for age <45, but would be 93rd percentile for age 45.

## 2023-07-13 NOTE — Assessment & Plan Note (Signed)
Reviewed lifestyle  changes and  associated risks including CVD.

## 2023-07-24 ENCOUNTER — Other Ambulatory Visit: Payer: Self-pay | Admitting: Family Medicine

## 2023-07-29 ENCOUNTER — Other Ambulatory Visit: Payer: Self-pay | Admitting: Family Medicine

## 2023-07-29 DIAGNOSIS — E291 Testicular hypofunction: Secondary | ICD-10-CM

## 2023-07-29 MED ORDER — TESTOSTERONE CYPIONATE 200 MG/ML IM SOLN
INTRAMUSCULAR | 1 refills | Status: DC
Start: 2023-07-29 — End: 2024-02-24

## 2023-07-29 NOTE — Telephone Encounter (Signed)
 Copied from CRM [id number]. Topic: Clinical - Medication Refill >> Jul 29, 2023 10:19 AM Viola FALCON wrote: Most Recent Primary Care Visit:  Provider: [name] NO E  Department: [facility]  Visit Type: PHYSICAL  Date: 07/01/2023  Medication: testosterone  cypionate (DEPOTESTOSTERONE CYPIONATE) 200 MG/ML injection   Has the patient contacted their pharmacy? Yes, patient has been trying to fill this for 2 weeks  (Agent: If no, request that the patient contact the pharmacy for the refill. If patient does not wish to contact the pharmacy document the reason why and proceed with request.) (Agent: If yes, when and what did the pharmacy advise?)  Is this the correct pharmacy for this prescription? Yes If no, delete pharmacy and type the correct one.  This is the patient's preferred pharmacy:   [facility] [phone] [place] - [address] [place] [address] Phone: [phone] Fax: [phone]   Has the prescription been filled recently? Yes  Is the patient out of the medication? Yes, patient a month   Has the patient been seen for an appointment in the last year OR does the patient have an upcoming appointment? Yes  Can we respond through MyChart? Yes  Agent: Please be advised that Rx refills may take up to 3 business days. We ask that you follow-up with your pharmacy.

## 2023-08-20 DIAGNOSIS — G4733 Obstructive sleep apnea (adult) (pediatric): Secondary | ICD-10-CM | POA: Diagnosis not present

## 2023-08-29 NOTE — Progress Notes (Signed)
Cardiology Office Note:    Date:  08/31/2023   ID:  Carl Roberts, DOB Sep 11, 1975, MRN [id number]  PCP:  Excell Seltzer, MD  Cardiologist:  None  Electrophysiologist:  None   Referring MD: Laurence Spates, MD   Chief Complaint  Patient presents with   Coronary Artery Disease    History of Present Illness:    Carl Roberts is a 48 y.o. male with a hx of CAD, hypertension, hyperlipidemia who presents as an ED follow-up for chest pain.  He was seen in the ED on 03/2023.    He reports he has been having rare chest pain.  Describes as sharp, occurs at rest.  He walks daily after lunch at work for 20 minutes.  Denies any dyspnea, lightheadedness, syncope, lower extremity edema, or palpitations.  Reports some lightheadedness but denies any syncope.  Smoked some in high school but none since.  Family history includes both grandfathers had MIs, no cardiac history in immediate family.  Reports BP has been 130s over 90s when checks at home.  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      Past Medical History:  Diagnosis Date   Asthma    Hyperlipidemia    Hypertension    Low testosterone    Pituitary adenoma (HCC) 10/26/2011   Overview:  Last MRI normal 10/2011   Stomach ulcer     Past Surgical History:  Procedure Laterality Date   BACK SURGERY      Current Medications: Current Meds  Medication Sig   amLODipine (NORVASC) 10 MG tablet Take 1 tablet (10 mg total) by mouth daily.   lisinopril-hydrochlorothiazide (ZESTORETIC) 20-12.5 MG tablet TAKE 2 TABLETS BY MOUTH EVERY DAY   NEEDLE, DISP, 25 G (BD HYPODERMIC NEEDLE) 25G X 1-1/2" MISC USE 1 EACH EVERY 14 (FOURTEEN) DAYS (FOR ADMINISTERING THE MEDICATION)   rosuvastatin (CRESTOR) 20 MG tablet Take 1 tablet (20 mg total) by mouth daily.   testosterone cypionate  (DEPOTESTOSTERONE CYPIONATE) 200 MG/ML injection INJECT 0.5 MLS (100 MG TOTAL) INTO THE MUSCLE ONCE A WEEK. (SINGLE USE VIALS)   triamcinolone cream (KENALOG) 0.1 % APPLY 1 APPLICATION TOPICALLY TWICE A DAY AS NEEDED   [DISCONTINUED] amLODipine (NORVASC) 5 MG tablet TAKE 1 TABLET (5 MG TOTAL) BY MOUTH DAILY.     Allergies:   Naproxen and Shellfish allergy   Social History   Socioeconomic History   Marital status: Married    Spouse name: Megan   Number of children: 2   Years of education: high school, some college   Highest education level: Not on file  Occupational History   Not on file  Tobacco Use   Smoking status: Former    Current packs/day: 0.00    Average packs/day: 0.5 packs/day for 4.0 years (2.0 ttl pk-yrs)    Types: Cigarettes, Pipe    Start date: 08/03/1994    Quit date: 08/03/1998    Years since quitting: 25.0   Smokeless tobacco: Never  Vaping Use   Vaping status: Never Used  Substance and Sexual Activity   Alcohol use: Yes    Comment: 1 glass of wine, liquor on the weekends   Drug use: No   Sexual activity: Yes    Birth control/protection: None  Other Topics Concern   Not on file  Social History Narrative   Lives with wife,  Megan   2 children   Exercise: tries to get 3 days a week, was running 2 times a week   Social Drivers of Corporate investment banker Strain: Low Risk  (08/03/2018)   Overall Financial Resource Strain (CARDIA)    Difficulty of Paying Living Expenses: Not hard at all  Food Insecurity: No Food Insecurity (09/06/2020)   Received [facility], [facility]   Hunger Vital Sign    Worried About Running Out of Food in the Last Year: Never true    Ran Out of Food in the Last Year: Never true  Transportation Needs: Not on file  Physical Activity: Not on file  Stress: Not on file  Social Connections: Unknown (11/17/2021)   Received from [facility], [facility]   Social Network    Social Network: Not on file     Family  History: The patient's family history includes Allergies in his son; Alzheimer's disease in his paternal grandmother; Arthritis in his father and mother; Asthma in his maternal grandmother; COPD in his maternal grandfather; Diabetes in his mother; Glaucoma in his father; Heart attack (age of onset: 27) in his maternal grandfather and paternal grandfather; Heart disease in his maternal grandfather; Hyperlipidemia in his brother and maternal grandfather; Hypertension in his brother; Lung cancer in his maternal grandmother; Parkinson's disease in his paternal grandfather.  ROS:   Please see the history of present illness.     All other systems reviewed and are negative.  EKGs/Labs/Other Studies Reviewed:    The following studies were reviewed today:   EKG:   08/31/2023: Normal sinus rhythm, rate 89, no ST abnormalities  Recent Labs: 04/13/2023: Hemoglobin 16.3; Platelets 252 06/24/2023: ALT 57; BUN 13; Creatinine, Ser 0.89; Potassium 4.2; Sodium 136  Recent Lipid Panel    Component Value Date/Time   CHOL 278 (H) 06/24/2023 0742   TRIG (H) 06/24/2023 0742    451.0 Triglyceride is over 400; calculations on Lipids are invalid.   HDL 39.40 06/24/2023 0742   CHOLHDL 7 06/24/2023 0742   VLDL 58.6 (H) 09/18/2021 0822   LDLCALC 174 (H) 12/04/2019 0833   LDLDIRECT 168.0 06/24/2023 0742    Physical Exam:    VS:  BP (!) 144/80 (BP Location: Left Arm, Patient Position: Sitting, Cuff Size: Normal)   Pulse 89   Ht 6\' 1"  (1.854 m)   Wt (!) 322 lb 6.4 oz (146.2 kg)   SpO2 96%   BMI 42.54 kg/m     Wt Readings from Last 3 Encounters:  08/31/23 (!) 322 lb 6.4 oz (146.2 kg)  07/01/23 (!) 315 lb 8 oz (143.1 kg)  04/13/23 (!) 321 lb (145.6 kg)     GEN:  Well nourished, well developed in no acute distress HEENT: Normal NECK: No JVD; No carotid bruits LYMPHATICS: No lymphadenopathy CARDIAC: RRR, no murmurs, rubs, gallops RESPIRATORY:  Clear to auscultation without rales, wheezing or rhonchi   ABDOMEN: Soft, non-tender, non-distended MUSCULOSKELETAL:  No edema; No deformity  SKIN: Warm and dry NEUROLOGIC:  Alert and oriented x 3 PSYCHIATRIC:  Normal affect   ASSESSMENT:    1. Chest pain of uncertain etiology   2. Coronary artery disease involving native coronary artery of native heart, unspecified whether angina present   3. Essential hypertension   4. Hyperlipidemia, mixed   5. Morbid obesity (HCC)    PLAN:    CAD: He was seen in the ED on 03/2023 for chest pain.  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 -Not a good coronary CTA candidate given BMI.  Recommend stress PET to evaluate for ischemia -Echocardiogram to rule out structural heart disease -Start rosuvastatin 20 mg daily  Hypertension: On lisinopril-HCTZ 40/25 mg daily and amlodipine 5 mg daily.  BP elevated in clinic today and reports elevated when checks at home.  Recommend increase amlodipine to 10 mg daily.  Asked to check BP daily for next 2 weeks and let us know results  Hyperlipidemia: LDL 168 on 06/24/2023.  Elevated calcium score as above.  Recommend starting rosuvastatin 20 mg daily and check fasting lipid panel in 2 months  Morbid obesity: Body mass index is 42.54 kg/m.  Diet/exercise encouraged  RTC in 3 months   Medication Adjustments/Labs and Tests Ordered: Current medicines are reviewed at length with the patient today.  Concerns regarding medicines are outlined above.  Orders Placed This Encounter  Procedures   NM PET CT CARDIAC PERFUSION MULTI W/ABSOLUTE BLOODFLOW   Comprehensive Metabolic Panel (CMET)   Lipid panel   EKG 12-Lead   ECHOCARDIOGRAM COMPLETE   Meds ordered this encounter  Medications   rosuvastatin (CRESTOR) 20 MG tablet    Sig: Take 1 tablet (20 mg total) by mouth daily.    Dispense:  90 tablet     Refill:  3   amLODipine (NORVASC) 10 MG tablet    Sig: Take 1 tablet (10 mg total) by mouth daily.    Dispense:  90 tablet    Refill:  3    Patient Instructions  Medication Instructions:  Increase Amlodipine 10 mg daily Start Rosuvastatin 20 mg daily Continue all current medications *If you need a refill on your cardiac medications before your next appointment, please call your pharmacy*   Lab Work: Cmet lipid panel in 2 months  please make sure you are fasting for this lab If you have labs (blood work) drawn today and your tests are completely normal, you will receive your results only by: MyChart Message (if you have MyChart) OR A paper copy in the mail If you have any lab test that is abnormal or we need to change your treatment, we will call you to review the results.   Testing/Procedures: Echo  Your physician has requested that you have an echocardiogram. Echocardiography is a painless test that uses sound waves to create images of your heart. It provides your doctor with information about the size and shape of your heart and how well your heart's chambers and valves are working. This procedure takes approximately one hour. There are no restrictions for this procedure. Please do NOT wear cologne, perfume, aftershave, or lotions (deodorant is allowed). Please arrive 15 minutes prior to your appointment time.  Please note: We ask at that you not bring children with you during ultrasound (echo/ vascular) testing. Due to room size and safety concerns, children are not allowed in the ultrasound rooms during exams. Our front office staff cannot provide observation of children in our lobby area while testing is being conducted. An adult accompanying a patient to their appointment will only be allowed in the ultrasound room at the discretion of the ultrasound technician under special circumstances. We apologize for any inconvenience.    Follow-Up: At [facility], you and  your health needs are our priority.  As part of our continuing mission to provide you with exceptional heart care, we have  created designated Provider Care Teams.  These Care Teams include your primary Cardiologist (physician) and Advanced Practice Providers (APPs -  Physician Assistants and Nurse Practitioners) who all work together to provide you with the care you need, when you need it.  We recommend signing up for the patient portal called "MyChart".  Sign up information is provided on this After Visit Summary.  MyChart is used to connect with patients for Virtual Visits (Telemedicine).  Patients are able to view lab/test results, encounter notes, upcoming appointments, etc.  Non-urgent messages can be sent to your provider as well.   To learn more about what you can do with MyChart, go to [email or web address].    Your next appointment:   3 month(s)  Provider:   Dr.Haidy Kackley  Other Instructions Please check blood pressure daily for next 2 weeks and send via my chart     Please report to Radiology at the [facility] Main Entrance 30 minutes early for your test.  [address] [place], [place] [address]                         How to Prepare for Your Cardiac PET/CT Stress Test:  Nothing to eat or drink, except water, 3 hours prior to arrival time.  NO caffeine/decaffeinated products, or chocolate 12 hours prior to arrival. (Please note decaffeinated beverages (teas/coffees) still contain caffeine).  If you have caffeine within 12 hours prior, the test will need to be rescheduled.  Medication instructions: Do not take erectile dysfunction medications for 72 hours prior to test (sildenafil, tadalafil) Do not take nitrates (isosorbide mononitrate, Ranexa) the day before or day of test Do not take tamsulosin the day before or morning of test Hold theophylline containing medications for 12 hours. Hold Dipyridamole 48 hours prior to the test.  Diabetic Preparation: If  able to eat breakfast prior to 3 hour fasting, you may take all medications, including your insulin. Do not worry if you miss your breakfast dose of insulin - start at your next meal. If you do not eat prior to 3 hour fast-Hold all diabetes (oral and insulin) medications. Patients who wear a continuous glucose monitor MUST remove the device prior to scanning.  You may take your remaining medications with water.  NO perfume, cologne or lotion on chest or abdomen area.   Total time is 1 to 2 hours; you may want to bring reading material for the waiting time.    In preparation for your appointment, medication and supplies will be purchased.  Appointment availability is limited, so if you need to cancel or reschedule, please call the Radiology Department Scheduler at [phone] 24 hours in advance to avoid a cancellation fee of $100.00  What to Expect When you Arrive:  Once you arrive and check in for your appointment, you will be taken to a preparation room within the Radiology Department.  A technologist or Nurse will obtain your medical history, verify that you are correctly prepped for the exam, and explain the procedure.  Afterwards, an IV will be started in your arm and electrodes will be placed on your skin for EKG monitoring during the stress portion of the exam. Then you will be escorted to the PET/CT scanner.  There, staff will get you positioned on the scanner and obtain a blood pressure and EKG.  During the exam, you will continue to be connected to the EKG and blood pressure machines.  A small, safe amount of  a radioactive tracer will be injected in your IV to obtain a series of pictures of your heart along with an injection of a stress agent.    After your Exam:  It is recommended that you eat a meal and drink a caffeinated beverage to counter act any effects of the stress agent.  Drink plenty of fluids for the remainder of the day and urinate frequently for the first couple of  hours after the exam.  Your doctor will inform you of your test results within 7-10 business days.  For more information and frequently asked questions, please visit our website: [email or web address]  For questions about your test or how to prepare for your test, please call: Cardiac Imaging Nurse Navigators Office: [phone]        Signed, Little Ishikawa, MD  08/31/2023 5:06 PM    [facility]

## 2023-08-31 ENCOUNTER — Ambulatory Visit: Payer: BC Managed Care – PPO | Attending: Cardiology | Admitting: Cardiology

## 2023-08-31 ENCOUNTER — Encounter: Payer: Self-pay | Admitting: Gastroenterology

## 2023-08-31 ENCOUNTER — Encounter: Payer: Self-pay | Admitting: Cardiology

## 2023-08-31 VITALS — BP 144/80 | HR 89 | Ht 73.0 in | Wt 322.4 lb

## 2023-08-31 DIAGNOSIS — R079 Chest pain, unspecified: Secondary | ICD-10-CM | POA: Diagnosis not present

## 2023-08-31 DIAGNOSIS — I10 Essential (primary) hypertension: Secondary | ICD-10-CM

## 2023-08-31 DIAGNOSIS — E782 Mixed hyperlipidemia: Secondary | ICD-10-CM | POA: Diagnosis not present

## 2023-08-31 DIAGNOSIS — I2510 Atherosclerotic heart disease of native coronary artery without angina pectoris: Secondary | ICD-10-CM

## 2023-08-31 MED ORDER — ROSUVASTATIN CALCIUM 20 MG PO TABS
20.0000 mg | ORAL_TABLET | Freq: Every day | ORAL | 3 refills | Status: DC
Start: 2023-08-31 — End: 2023-10-27

## 2023-08-31 MED ORDER — AMLODIPINE BESYLATE 10 MG PO TABS
10.0000 mg | ORAL_TABLET | Freq: Every day | ORAL | 3 refills | Status: AC
Start: 2023-08-31 — End: 2024-04-26

## 2023-08-31 NOTE — Patient Instructions (Signed)
Medication Instructions:  Increase Amlodipine 10 mg daily Start Rosuvastatin 20 mg daily Continue all current medications *If you need a refill on your cardiac medications before your next appointment, please call your pharmacy*   Lab Work: Cmet lipid panel in 2 months  please make sure you are fasting for this lab If you have labs (blood work) drawn today and your tests are completely normal, you will receive your results only by: MyChart Message (if you have MyChart) OR A paper copy in the mail If you have any lab test that is abnormal or we need to change your treatment, we will call you to review the results.   Testing/Procedures: Echo  Your physician has requested that you have an echocardiogram. Echocardiography is a painless test that uses sound waves to create images of your heart. It provides your doctor with information about the size and shape of your heart and how well your heart's chambers and valves are working. This procedure takes approximately one hour. There are no restrictions for this procedure. Please do NOT wear cologne, perfume, aftershave, or lotions (deodorant is allowed). Please arrive 15 minutes prior to your appointment time.  Please note: We ask at that you not bring children with you during ultrasound (echo/ vascular) testing. Due to room size and safety concerns, children are not allowed in the ultrasound rooms during exams. Our front office staff cannot provide observation of children in our lobby area while testing is being conducted. An adult accompanying a patient to their appointment will only be allowed in the ultrasound room at the discretion of the ultrasound technician under special circumstances. We apologize for any inconvenience.    Follow-Up: At [facility], you and your health needs are our priority.  As part of our continuing mission to provide you with exceptional heart care, we have created designated Provider Care Teams.  These Care  Teams include your primary Cardiologist (physician) and Advanced Practice Providers (APPs -  Physician Assistants and Nurse Practitioners) who all work together to provide you with the care you need, when you need it.  We recommend signing up for the patient portal called "MyChart".  Sign up information is provided on this After Visit Summary.  MyChart is used to connect with patients for Virtual Visits (Telemedicine).  Patients are able to view lab/test results, encounter notes, upcoming appointments, etc.  Non-urgent messages can be sent to your provider as well.   To learn more about what you can do with MyChart, go to [email or web address].    Your next appointment:   3 month(s)  Provider:   Dr.Schumann  Other Instructions Please check blood pressure daily for next 2 weeks and send via my chart     Please report to Radiology at the [facility] Main Entrance 30 minutes early for your test.  [address] [place], [place] [address]                         How to Prepare for Your Cardiac PET/CT Stress Test:  Nothing to eat or drink, except water, 3 hours prior to arrival time.  NO caffeine/decaffeinated products, or chocolate 12 hours prior to arrival. (Please note decaffeinated beverages (teas/coffees) still contain caffeine).  If you have caffeine within 12 hours prior, the test will need to be rescheduled.  Medication instructions: Do not take erectile dysfunction medications for 72 hours prior to test (sildenafil, tadalafil) Do not take nitrates (isosorbide mononitrate, Ranexa) the  day before or day of test Do not take tamsulosin the day before or morning of test Hold theophylline containing medications for 12 hours. Hold Dipyridamole 48 hours prior to the test.  Diabetic Preparation: If able to eat breakfast prior to 3 hour fasting, you may take all medications, including your insulin. Do not worry if you miss your breakfast dose of insulin - start at your next  meal. If you do not eat prior to 3 hour fast-Hold all diabetes (oral and insulin) medications. Patients who wear a continuous glucose monitor MUST remove the device prior to scanning.  You may take your remaining medications with water.  NO perfume, cologne or lotion on chest or abdomen area.   Total time is 1 to 2 hours; you may want to bring reading material for the waiting time.    In preparation for your appointment, medication and supplies will be purchased.  Appointment availability is limited, so if you need to cancel or reschedule, please call the Radiology Department Scheduler at [phone] 24 hours in advance to avoid a cancellation fee of $100.00  What to Expect When you Arrive:  Once you arrive and check in for your appointment, you will be taken to a preparation room within the Radiology Department.  A technologist or Nurse will obtain your medical history, verify that you are correctly prepped for the exam, and explain the procedure.  Afterwards, an IV will be started in your arm and electrodes will be placed on your skin for EKG monitoring during the stress portion of the exam. Then you will be escorted to the PET/CT scanner.  There, staff will get you positioned on the scanner and obtain a blood pressure and EKG.  During the exam, you will continue to be connected to the EKG and blood pressure machines.  A small, safe amount of a radioactive tracer will be injected in your IV to obtain a series of pictures of your heart along with an injection of a stress agent.    After your Exam:  It is recommended that you eat a meal and drink a caffeinated beverage to counter act any effects of the stress agent.  Drink plenty of fluids for the remainder of the day and urinate frequently for the first couple of hours after the exam.  Your doctor will inform you of your test results within 7-10 business days.  For more information and frequently asked questions, please visit our  website: [email or web address]  For questions about your test or how to prepare for your test, please call: Cardiac Imaging Nurse Navigators Office: [phone]

## 2023-09-13 ENCOUNTER — Telehealth: Payer: Self-pay | Admitting: *Deleted

## 2023-09-13 DIAGNOSIS — E782 Mixed hyperlipidemia: Secondary | ICD-10-CM

## 2023-09-13 NOTE — Telephone Encounter (Signed)
-----   Message from Lovena Neighbours sent at 09/13/2023 10:57 AM EST ----- Regarding: Labs for Wednesday 3.12.25 Please put lab orders in future. Thank you, Denny Peon

## 2023-09-14 ENCOUNTER — Other Ambulatory Visit: Payer: Self-pay | Admitting: Family Medicine

## 2023-09-14 DIAGNOSIS — I10 Essential (primary) hypertension: Secondary | ICD-10-CM

## 2023-09-27 ENCOUNTER — Encounter: Payer: Self-pay | Admitting: Family Medicine

## 2023-09-27 ENCOUNTER — Other Ambulatory Visit (INDEPENDENT_AMBULATORY_CARE_PROVIDER_SITE_OTHER)

## 2023-09-27 DIAGNOSIS — E782 Mixed hyperlipidemia: Secondary | ICD-10-CM | POA: Diagnosis not present

## 2023-09-27 LAB — COMPREHENSIVE METABOLIC PANEL
ALT: 39 U/L (ref 0–53)
AST: 11 U/L (ref 0–37)
Albumin: 4.5 g/dL (ref 3.5–5.2)
Alkaline Phosphatase: 59 U/L (ref 39–117)
BUN: 12 mg/dL (ref 6–23)
CO2: 26 meq/L (ref 19–32)
Calcium: 9.7 mg/dL (ref 8.4–10.5)
Chloride: 102 meq/L (ref 96–112)
Creatinine, Ser: 0.86 mg/dL (ref 0.40–1.50)
GFR: 104.28 mL/min (ref >60.00)
Glucose, Bld: 98 mg/dL (ref 70–99)
Potassium: 4 meq/L (ref 3.5–5.1)
Sodium: 138 meq/L (ref 135–145)
Total Bilirubin: 0.5 mg/dL (ref 0.2–1.2)
Total Protein: 7.5 g/dL (ref 6.0–8.3)

## 2023-09-27 LAB — LIPID PANEL
Cholesterol: 250 mg/dL — ABNORMAL HIGH (ref 0–200)
HDL: 38.6 mg/dL — ABNORMAL LOW (ref >39.00)
LDL Cholesterol: 137 mg/dL — ABNORMAL HIGH (ref 0–99)
NonHDL: 211.7
Total CHOL/HDL Ratio: 6
Triglycerides: 373 mg/dL — ABNORMAL HIGH (ref 0.0–149.0)
VLDL: 74.6 mg/dL — ABNORMAL HIGH (ref 0.0–40.0)

## 2023-09-28 ENCOUNTER — Encounter: Payer: Self-pay | Admitting: Cardiology

## 2023-09-28 ENCOUNTER — Encounter: Payer: Self-pay | Admitting: Family Medicine

## 2023-09-29 ENCOUNTER — Other Ambulatory Visit: Payer: BC Managed Care – PPO

## 2023-09-29 DIAGNOSIS — Z713 Dietary counseling and surveillance: Secondary | ICD-10-CM | POA: Diagnosis not present

## 2023-10-01 ENCOUNTER — Other Ambulatory Visit: Payer: Self-pay | Admitting: Family Medicine

## 2023-10-01 NOTE — Telephone Encounter (Signed)
 Last office visit 07/01/23 for CPE.   Last refilled 07/01/23 for #12 with no refills.  Vit D level 06/24/23 low at 16.88.  Next Appt: No future appointments with PCP.

## 2023-10-06 ENCOUNTER — Ambulatory Visit (HOSPITAL_COMMUNITY): Payer: BC Managed Care – PPO | Attending: Cardiology

## 2023-10-06 DIAGNOSIS — R079 Chest pain, unspecified: Secondary | ICD-10-CM | POA: Insufficient documentation

## 2023-10-06 LAB — ECHOCARDIOGRAM COMPLETE
AR max vel: 2.19 cm2
AV Area VTI: 2.24 cm2
AV Area mean vel: 2.26 cm2
AV Mean grad: 10 mmHg
AV Peak grad: 19.7 mmHg
Ao pk vel: 2.22 m/s
Area-P 1/2: 2.55 cm2
S' Lateral: 3.1 cm

## 2023-10-07 DIAGNOSIS — Z713 Dietary counseling and surveillance: Secondary | ICD-10-CM | POA: Diagnosis not present

## 2023-10-08 ENCOUNTER — Encounter: Payer: Self-pay | Admitting: *Deleted

## 2023-10-12 ENCOUNTER — Ambulatory Visit: Payer: BC Managed Care – PPO

## 2023-10-12 ENCOUNTER — Encounter: Payer: Self-pay | Admitting: Gastroenterology

## 2023-10-12 VITALS — Ht 73.2 in | Wt 309.0 lb

## 2023-10-12 DIAGNOSIS — Z1211 Encounter for screening for malignant neoplasm of colon: Secondary | ICD-10-CM

## 2023-10-12 MED ORDER — SUTAB 1479-225-188 MG PO TABS: 24.0000 | ORAL_TABLET | Freq: Once | ORAL | 0 refills | Status: AC

## 2023-10-12 NOTE — Progress Notes (Signed)
 No egg or soy allergy known to patient  No issues known to pt with past sedation with any surgeries or procedures Patient denies ever being told they had issues or difficulty with intubation  No FH of Malignant Hyperthermia Pt is not on diet pills Pt is not on  home 02  Pt is not on blood thinners  Pt denies issues with constipation  No A fib or A flutter Have any cardiac testing pending--YES- 10/06/2023 Echocardiogram  Pt can ambulate  Pt denies use of chewing tobacco Discussed diabetic I weight loss medication holds Discussed NSAID holds Checked BMI Pt instructed to use Singlecare.com or GoodRx for a price reduction on prep  Patient's chart reviewed by Cathlyn Parsons CNRA prior to previsit and patient appropriate for the [facility].  Pre visit completed and red dot placed by patient's name on their procedure day (on provider's schedule).

## 2023-10-14 DIAGNOSIS — Z713 Dietary counseling and surveillance: Secondary | ICD-10-CM | POA: Diagnosis not present

## 2023-10-15 ENCOUNTER — Encounter: Payer: Self-pay | Admitting: Cardiology

## 2023-10-20 ENCOUNTER — Other Ambulatory Visit: Payer: Self-pay | Admitting: *Deleted

## 2023-10-20 DIAGNOSIS — E782 Mixed hyperlipidemia: Secondary | ICD-10-CM

## 2023-10-20 NOTE — Progress Notes (Signed)
 Called and made patient aware that per Dr. Bjorn Pippin referral for Pharm D was ordered and someone should be reaching out to get that appt. Schedule. Understanding verbalized.

## 2023-10-20 NOTE — Telephone Encounter (Signed)
Recommend referral to pharmacy lipid clinic to evaluate for PCSK9 inhibitor

## 2023-10-22 ENCOUNTER — Telehealth: Payer: Self-pay | Admitting: Cardiology

## 2023-10-22 NOTE — Telephone Encounter (Signed)
 Spoke to patient he stated he cannot take Crestor causes cramping and brain fog.Stated he would like to know what medication he can take until he sees Pharm D 12/20/23.I will send message to Dr.Schumann for advice.

## 2023-10-22 NOTE — Telephone Encounter (Signed)
 Pt c/o medication issue:  1. Name of Medication:   rosuvastatin (CRESTOR) 20 MG tablet   2. How are you currently taking this medication (dosage and times per day)?   Not taking  3. Are you having a reaction (difficulty breathing--STAT)?   4. What is your medication issue?   Patient stated he wants to change this medication.  Patient noted he has an appointment schedule with pharmacist on 6/2.

## 2023-10-24 NOTE — Telephone Encounter (Signed)
 Recommend stopping rosuvastatin until follow-up with pharmacy lipid clinic to evaluate for PCSK9 inhibitor

## 2023-10-26 DIAGNOSIS — M25561 Pain in right knee: Secondary | ICD-10-CM | POA: Diagnosis not present

## 2023-10-27 ENCOUNTER — Encounter: Payer: Self-pay | Admitting: *Deleted

## 2023-10-27 NOTE — Telephone Encounter (Signed)
 Called and unable to reach patient left message on patient's personal cell phone to stop Rosuvastatin until follow up with Pharmacy lipid clinic.  Left message to call office for any questions.

## 2023-11-03 ENCOUNTER — Other Ambulatory Visit: Payer: Self-pay | Admitting: Family Medicine

## 2023-11-04 DIAGNOSIS — R0781 Pleurodynia: Secondary | ICD-10-CM | POA: Diagnosis not present

## 2023-11-04 DIAGNOSIS — M1711 Unilateral primary osteoarthritis, right knee: Secondary | ICD-10-CM | POA: Diagnosis not present

## 2023-11-10 DIAGNOSIS — Z713 Dietary counseling and surveillance: Secondary | ICD-10-CM | POA: Diagnosis not present

## 2023-11-11 DIAGNOSIS — M1711 Unilateral primary osteoarthritis, right knee: Secondary | ICD-10-CM | POA: Diagnosis not present

## 2023-11-12 ENCOUNTER — Encounter: Payer: BC Managed Care – PPO | Admitting: Gastroenterology

## 2023-11-18 DIAGNOSIS — M1711 Unilateral primary osteoarthritis, right knee: Secondary | ICD-10-CM | POA: Diagnosis not present

## 2023-11-29 ENCOUNTER — Encounter (HOSPITAL_COMMUNITY): Payer: Self-pay

## 2023-11-29 ENCOUNTER — Other Ambulatory Visit (HOSPITAL_COMMUNITY): Payer: Self-pay | Admitting: *Deleted

## 2023-11-29 DIAGNOSIS — R079 Chest pain, unspecified: Secondary | ICD-10-CM

## 2023-11-30 ENCOUNTER — Telehealth (HOSPITAL_COMMUNITY): Payer: Self-pay | Admitting: *Deleted

## 2023-11-30 NOTE — Telephone Encounter (Signed)
 Attempted to call patient regarding upcoming cardiac PET appointment. Unable to leave VM. Johney Frame RN Navigator Cardiac Imaging [facility] [phone] Office

## 2023-12-01 ENCOUNTER — Ambulatory Visit (HOSPITAL_COMMUNITY)
Admission: RE | Admit: 2023-12-01 | Discharge: 2023-12-01 | Disposition: A | Discharge status: Home / Self Care | Source: Ambulatory Visit | Attending: Cardiology | Admitting: Cardiology

## 2023-12-01 DIAGNOSIS — R079 Chest pain, unspecified: Secondary | ICD-10-CM | POA: Insufficient documentation

## 2023-12-01 MED ORDER — REGADENOSON 0.4 MG/5ML IV SOLN
INTRAVENOUS | Status: AC
  Filled 2023-12-01: qty 5

## 2023-12-01 MED ORDER — RUBIDIUM RB82 GENERATOR (RUBYFILL)
28.2000 | PACK | Freq: Once | INTRAVENOUS | Status: AC
  Administered 2023-12-01: 28.2 via INTRAVENOUS

## 2023-12-01 MED ORDER — REGADENOSON 0.4 MG/5ML IV SOLN
0.4000 mg | Freq: Once | INTRAVENOUS | Status: AC
  Administered 2023-12-01: 0.4 mg via INTRAVENOUS

## 2023-12-01 MED ORDER — RUBIDIUM RB82 GENERATOR (RUBYFILL)
28.1800 | PACK | Freq: Once | INTRAVENOUS | Status: AC
  Administered 2023-12-01: 28.18 via INTRAVENOUS

## 2023-12-02 LAB — NM PET CT CARDIAC PERFUSION MULTI W/ABSOLUTE BLOODFLOW
LV dias vol: 129 mL (ref 62–150)
LV sys vol: 47 mL
MBFR: 1.88
Nuc Rest EF: 56 %
Nuc Stress EF: 64 %
Peak HR: 93 {beats}/min
Rest HR: 85 {beats}/min
Rest MBF: 0.76 ml/g/min
Rest Nuclear Isotope Dose: 28.2 mCi
ST Depression (mm): 0 mm
Stress MBF: 1.43 ml/g/min
Stress Nuclear Isotope Dose: 28.2 mCi
TID: 1.04

## 2023-12-05 ENCOUNTER — Ambulatory Visit: Payer: Self-pay | Admitting: Cardiology

## 2023-12-13 NOTE — Progress Notes (Unsigned)
 Cardiology Office Note:    Date:  12/14/2023   ID:  Carl Roberts, DOB 1976/02/20, MRN [id number]  PCP:  Carl Novas, MD  Cardiologist:  None  Electrophysiologist:  None   Referring MD: Carl Novas, MD   No chief complaint on file.   History of Present Illness:    Carl Roberts is a 48 y.o. male with a hx of CAD, hypertension, hyperlipidemia, OSA who presents for follow-up .  He was seen in the ED on 03/2023 for chest pain.    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  Echocardiogram 10/06/2023 showed EF 60 to 65%, normal RV function, no significant valvular disease, mild aortic root dilatation measuring 42 mm.  Stress PET 12/01/2023 showed grossly normal perfusion, low risk study, EF 56%, mildly abnormal myocardial blood flow reserve (1.88) but poor quality due to motion.  Since last clinic visit, he reports he is doing well. Denies any chest pain, dyspnea, lightheadedness, syncope, lower extremity edema, or palpitations.  Not exercising much due to knee pain.   Wt Readings from Last 3 Encounters:  12/14/23 (!) 316 lb 3.2 oz (143.4 kg)  10/12/23 (!) 309 lb (140.2 kg)  08/31/23 (!) 322 lb 6.4 oz (146.2 kg)        Past Medical History:  Diagnosis Date   Allergy    Asthma    Hyperlipidemia    Hypertension    Low testosterone     Pituitary adenoma (HCC) 10/26/2011   Overview:  Last MRI normal 10/2011   Sleep apnea    Stomach ulcer     Past Surgical History:  Procedure Laterality Date   BACK SURGERY      Current Medications: Current Meds  Medication Sig   Cholecalciferol  (VITAMIN D3) 250 MCG (10000 UT) capsule Take 10,000 Units by mouth daily.   lisinopril -hydrochlorothiazide  (ZESTORETIC ) 20-12.5 MG tablet TAKE 2 TABLETS BY MOUTH EVERY DAY   NEEDLE, DISP, 25 G (BD HYPODERMIC NEEDLE) 25G X 1-1/2" MISC  USE 1 EACH EVERY 14 (FOURTEEN) DAYS (FOR ADMINISTERING THE MEDICATION)   testosterone  cypionate (DEPOTESTOSTERONE CYPIONATE) 200 MG/ML injection INJECT 0.5 MLS (100 MG TOTAL) INTO THE MUSCLE ONCE A WEEK. (SINGLE USE VIALS)   triamcinolone  cream (KENALOG ) 0.1 % APPLY 1 APPLICATION TOPICALLY TWICE A DAY AS NEEDED     Allergies:   Naproxen and Shellfish allergy   Social History   Socioeconomic History   Marital status: Married    Spouse name: [name]   Number of children: 2   Years of education: high school, some college   Highest education level: Not on file  Occupational History   Not on file  Tobacco Use   Smoking status: Former    Current packs/day: 0.00    Average packs/day: 0.5 packs/day for 4.0 years (2.0 ttl pk-yrs)    Types: Cigarettes, Pipe    Start date: 08/03/1994    Quit date: 08/03/1998    Years since quitting: 25.3   Smokeless tobacco: Never  Vaping Use   Vaping status: Never Used  Substance and Sexual Activity   Alcohol use: Yes    Comment: 1 glass of wine, liquor on the weekends   Drug use: No   Sexual activity: Yes    Birth control/protection: None  Other Topics Concern   Not on file  Social History Narrative   Lives with  wife, Carl Roberts   2 children   Exercise: tries to get 3 days a week, was running 2 times a week   Social Drivers of Corporate investment banker Strain: Low Risk  (08/03/2018)   Overall Financial Resource Strain (CARDIA)    Difficulty of Paying Living Expenses: Not hard at all  Food Insecurity: No Food Insecurity (09/06/2020)   Received from [facility], [facility]   Hunger Vital Sign    Worried About Running Out of Food in the Last Year: Never true    Ran Out of Food in the Last Year: Never true  Transportation Needs: Not on file  Physical Activity: Not on file  Stress: Not on file  Social Connections: Unknown (11/17/2021)   Received from [facility], [facility]   Social Network    Social Network: Not on file     Family  History: The patient's family history includes Allergies in his son; Alzheimer's disease in his paternal grandmother; Arthritis in his father and mother; Asthma in his maternal grandmother; COPD in his maternal grandfather; Diabetes in his mother; Glaucoma in his father; Heart attack (age of onset: 53) in his maternal grandfather and paternal grandfather; Heart disease in his maternal grandfather; Hyperlipidemia in his brother and maternal grandfather; Hypertension in his brother; Lung cancer in his maternal grandmother; Parkinson's disease in his paternal grandfather. There is no history of Colon cancer, Esophageal cancer, Rectal cancer, or Stomach cancer.  ROS:   Please see the history of present illness.     All other systems reviewed and are negative.  EKGs/Labs/Other Studies Reviewed:    The following studies were reviewed today:   EKG:   08/31/2023: Normal sinus rhythm, rate 89, no ST abnormalities  Recent Labs: 04/13/2023: Hemoglobin 16.3; Platelets 252 09/27/2023: ALT 39; BUN 12; Creatinine, Ser 0.86; Potassium 4.0; Sodium 138  Recent Lipid Panel    Component Value Date/Time   CHOL 250 (H) 09/27/2023 0742   TRIG 373.0 (H) 09/27/2023 0742   HDL 38.60 (L) 09/27/2023 0742   CHOLHDL 6 09/27/2023 0742   VLDL 74.6 (H) 09/27/2023 0742   LDLCALC 137 (H) 09/27/2023 0742   LDLDIRECT 168.0 06/24/2023 0742    Physical Exam:    VS:  BP 118/76 (BP Location: Right Arm, Patient Position: Sitting, Cuff Size: Normal)   Pulse 91   Ht 6\' 1"  (1.854 m)   Wt (!) 316 lb 3.2 oz (143.4 kg)   SpO2 96%   BMI 41.72 kg/m     Wt Readings from Last 3 Encounters:  12/14/23 (!) 316 lb 3.2 oz (143.4 kg)  10/12/23 (!) 309 lb (140.2 kg)  08/31/23 (!) 322 lb 6.4 oz (146.2 kg)     GEN:  Well nourished, well developed in no acute distress HEENT: Normal NECK: No JVD; No carotid bruits LYMPHATICS: No lymphadenopathy CARDIAC: RRR, no murmurs, rubs, gallops RESPIRATORY:  Clear to auscultation without  rales, wheezing or rhonchi  ABDOMEN: Soft, non-tender, non-distended MUSCULOSKELETAL:  No edema; No deformity  SKIN: Warm and dry NEUROLOGIC:  Alert and oriented x 3 PSYCHIATRIC:  Normal affect   ASSESSMENT:    1. Coronary artery disease involving native coronary artery of native heart, unspecified whether angina present   2. Hyperlipidemia, unspecified hyperlipidemia type   3. Essential hypertension   4. Morbid obesity (HCC)     PLAN:    CAD: He was seen in the ED on 03/2023 for chest pain.  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  Stress PET 12/01/2023 showed grossly normal perfusion, low risk study, EF 56%, mildly abnormal myocardial blood flow reserve (1.88) but poor quality due to motion.  Echocardiogram 10/06/2023 showed EF 60 to 65%, normal RV function, no significant valvular disease, mild aortic root dilatation measuring 42 mm. - Unable to tolerate statins due to myalgias.  Referred to pharmacy lipid clinic, has appointment on 6/2.  Hypertension: On lisinopril -HCTZ 40/25 mg daily and amlodipine  10 mg daily.  Appears controlled  Hyperlipidemia: LDL 168 on 06/24/2023.  Elevated calcium  score as above.  Started rosuvastatin  20 mg daily.  Unable to tolerate due to myalgias.  Refer to pharmacy lipid clinic to evaluate for PCSK9 inhibitor.  Has appointment 6/2.  Will update lipid panel  Morbid obesity: Body mass index is 41.72 kg/m.  Diet/exercise encouraged.  Seeing a weight loss clinic in [place]  OSA: reports compliance with CPAP  RTC in 6 months   Medication Adjustments/Labs and Tests Ordered: Current medicines are reviewed at length with the patient today.  Concerns regarding medicines are outlined above.  Orders Placed This Encounter  Procedures   Lipid panel   No orders of the  defined types were placed in this encounter.   Patient Instructions  Medication Instructions:  Continue current medication *If you need a refill on your cardiac medications before your next appointment, please call your pharmacy*  Lab Work:  Lipid panel today If you have labs (blood work) drawn today and your tests are completely normal, you will receive your results only by: MyChart Message (if you have MyChart) OR A paper copy in the mail If you have any lab test that is abnormal or we need to change your treatment, we will call you to review the results.  Testing/Procedures: none  Follow-Up: At [facility], you and your health needs are our priority.  As part of our continuing mission to provide you with exceptional heart care, our providers are all part of one team.  This team includes your primary Cardiologist (physician) and Advanced Practice Providers or APPs (Physician Assistants and Nurse Practitioners) who all work together to provide you with the care you need, when you need it.  Your next appointment:   6 month(s)  Provider:   Dr. Alda Amas  We recommend signing up for the patient portal called "MyChart".  Sign up information is provided on this After Visit Summary.  MyChart is used to connect with patients for Virtual Visits (Telemedicine).  Patients are able to view lab/test results, encounter notes, upcoming appointments, etc.  Non-urgent messages can be sent to your provider as well.   To learn more about what you can do with MyChart, go to [email or web address].   Other Instructions none       Signed, Wendie Hamburg, MD  12/14/2023 8:49 AM    [facility]

## 2023-12-14 ENCOUNTER — Encounter: Payer: Self-pay | Admitting: Cardiology

## 2023-12-14 ENCOUNTER — Ambulatory Visit: Payer: BC Managed Care – PPO | Attending: Cardiology | Admitting: Cardiology

## 2023-12-14 VITALS — BP 118/76 | HR 91 | Ht 73.0 in | Wt 316.2 lb

## 2023-12-14 DIAGNOSIS — I2510 Atherosclerotic heart disease of native coronary artery without angina pectoris: Secondary | ICD-10-CM

## 2023-12-14 DIAGNOSIS — E785 Hyperlipidemia, unspecified: Secondary | ICD-10-CM | POA: Diagnosis not present

## 2023-12-14 DIAGNOSIS — I10 Essential (primary) hypertension: Secondary | ICD-10-CM | POA: Diagnosis not present

## 2023-12-14 NOTE — Patient Instructions (Signed)
 Medication Instructions:  Continue current medication *If you need a refill on your cardiac medications before your next appointment, please call your pharmacy*  Lab Work:  Lipid panel today If you have labs (blood work) drawn today and your tests are completely normal, you will receive your results only by: MyChart Message (if you have MyChart) OR A paper copy in the mail If you have any lab test that is abnormal or we need to change your treatment, we will call you to review the results.  Testing/Procedures: none  Follow-Up: At [facility], you and your health needs are our priority.  As part of our continuing mission to provide you with exceptional heart care, our providers are all part of one team.  This team includes your primary Cardiologist (physician) and Advanced Practice Providers or APPs (Physician Assistants and Nurse Practitioners) who all work together to provide you with the care you need, when you need it.  Your next appointment:   6 month(s)  Provider:   Dr. Alda Amas  We recommend signing up for the patient portal called "MyChart".  Sign up information is provided on this After Visit Summary.  MyChart is used to connect with patients for Virtual Visits (Telemedicine).  Patients are able to view lab/test results, encounter notes, upcoming appointments, etc.  Non-urgent messages can be sent to your provider as well.   To learn more about what you can do with MyChart, go to [email or web address].   Other Instructions none

## 2023-12-15 ENCOUNTER — Ambulatory Visit: Payer: Self-pay | Admitting: Cardiology

## 2023-12-15 LAB — LIPID PANEL
Chol/HDL Ratio: 5.5 ratio — ABNORMAL HIGH (ref 0.0–5.0)
Cholesterol, Total: 242 mg/dL — ABNORMAL HIGH (ref 100–199)
HDL: 44 mg/dL (ref >39)
LDL Chol Calc (NIH): 159 mg/dL — ABNORMAL HIGH (ref 0–99)
Triglycerides: 211 mg/dL — ABNORMAL HIGH (ref 0–149)
VLDL Cholesterol Cal: 39 mg/dL (ref 5–40)

## 2023-12-20 ENCOUNTER — Encounter: Payer: Self-pay | Admitting: Pharmacist Clinician (PhC)/ Clinical Pharmacy Specialist

## 2023-12-20 ENCOUNTER — Ambulatory Visit: Attending: Cardiovascular Disease | Admitting: Pharmacist Clinician (PhC)/ Clinical Pharmacy Specialist

## 2023-12-20 DIAGNOSIS — E782 Mixed hyperlipidemia: Secondary | ICD-10-CM

## 2023-12-20 NOTE — Assessment & Plan Note (Signed)
 Assessment: Patient with ASCVD not at LDL goal of < 70 Most recent LDL 159 on 12/14/23 Not able to tolerate statins secondary to myalgias - rosuvastatin  Reviewed options for lowering LDL cholesterol, including ezetimibe, PCSK-9 inhibitors, bempedoic acid and inclisiran.  Discussed mechanisms of action, dosing, side effects, potential decreases in LDL cholesterol and costs.  Also reviewed potential options for patient assistance.  Plan: Patient agreeable to starting Repatha 140 mg q14d Will work on dietary/lifestyle to decrease triglycerides - encouraged to cut back to 1 drink per day Repeat labs after:  3 months Lipid Liver function Patient was given information Repatha copay card.  If cost prohibitive, will consider Leqvio pricing as well.

## 2023-12-20 NOTE — Patient Instructions (Signed)
 Your Results:             Your most recent labs Goal  Total Cholesterol 242 < 200  Triglycerides 211 < 150  HDL (happy/good cholesterol) 44 > 40  LDL (lousy/bad cholesterol 168 < 70   Medication changes:  We will start the process to get Repatha covered by your insurance.  Once the prior authorization is complete, I will send a MyChart message to let you know and confirm pharmacy information.   You will take one injection every 14 days  Lab orders:  We want to repeat labs after 2-3 months.  We will send you a lab order to remind you once we get closer to that time.    Repatha Co-pay Card Instructions 1.      Go to [email or web address]  2.      Click the red "Repahta Co-Pay Card" button near the top right 3.      Scroll down and click "Yes, I have a Repatha prescription 4.      Continue to scroll down and selected "[employer]"  5.      Continue to scroll down and for the question "Are you eligible for Medicare but receiving prescription drug coverage from a former employer, union, or welfare plan?" select "No" 6.      Continue to scroll down and click the first box which is next to "Repatha Co-Pay Card, and beneath that, select "I want to enroll in the Repatha Co-Pay Card Program" 7.      Continue to scroll down until you see the "Next" button, then click it 8.      Fill out your personal information then click the "Next" button    Thank you for choosing [facility]   High Triglycerides Eating Plan Triglycerides are a type of fat in the blood. High levels of triglycerides can increase your risk of heart disease and stroke. If your triglyceride levels are high, choosing the right foods can help lower your triglycerides and keep your heart healthy. Work with your health care provider or a dietitian to develop an eating plan that is right for you. What are tips for following this plan? General guidelines  Lose weight, if you are overweight. For most people, losing 5-10 lb  (2-5 kg) helps lower triglyceride levels. A weight-loss plan may include: 30 minutes of exercise at least 5 days a week. Reducing the amount of calories, sugar, and fat you eat. Eat a wide variety of fresh fruits, vegetables, and whole grains. These foods are high in fiber. Eat foods that contain healthy fats, such as fatty fish, nuts, seeds, and olive oil. Avoid foods that are high in added sugar, added salt (sodium), and saturated fat. Avoid low-fiber, refined carbohydrates such as white bread, crackers, noodles, and white rice. Avoid foods with trans fats or partially hydrogenated oils, such as fried foods or stick margarine. If you drink alcohol: Limit how much you have to: 0-1 drink a day for women who are not pregnant. 0-2 drinks a day for men. Your health care provider may recommend that you drink less than these amounts depending on your overall health. Know how much alcohol is in a drink. In the [place] one drink equals one 12 oz bottle of beer (355 mL), one 5 oz glass of wine (148 mL), or one 1 oz glass of hard liquor (44 mL). Reading food labels Check food labels for: The amount of saturated fat. Choose foods with no or very little  saturated fat (less than 2 g). The amount of trans fat. Choose foods with no transfat. The amount of cholesterol. Choose foods that are low in cholesterol. The amount of sodium. Choose foods with less than 140 milligrams (mg) per serving. Shopping Buy dairy products labeled as nonfat (skim) or low-fat (1%). Avoid buying processed or prepackaged foods. These are often high in added sugar, sodium, and fat. Cooking Choose healthy fats when cooking, such as olive oil, avocado oil, or canola oil. Cook foods using lower fat methods, such as baking, broiling, boiling, or grilling. Make your own sauces, dressings, and marinades when possible, instead of buying them. Store-bought sauces, dressings, and marinades are often high in sodium and sugar. Meal  planning Eat more home-cooked food and less restaurant, buffet, and fast food. Eat fatty fish at least 2 times each week. Examples of fatty fish include salmon, trout, sardines, mackerel, tuna, and herring. If you eat whole eggs, do not eat more than 4 egg yolks per week. What foods should I eat? Fruits All fresh, canned (in natural juice), or frozen fruits. Vegetables Fresh or frozen vegetables. Low-sodium canned vegetables. Grains Whole wheat or whole grain breads, crackers, cereals, and pasta. Unsweetened oatmeal. Bulgur. Barley. Quinoa. Brown rice. Whole wheat flour tortillas. Meats and other proteins Skinless chicken or [place]. Ground chicken or [place]. Lean cuts of pork, trimmed of fat. Fish and seafood, especially salmon, trout, and herring. Egg whites. Dried beans, peas, or lentils. Unsalted nuts or seeds. Unsalted canned beans. Natural peanut or almond butter or other nut butters. Dairy Low-fat dairy products. Skim or low-fat (1%) milk. Reduced fat (2%) and low-sodium cheese. Low-fat ricotta cheese. Low-fat cottage cheese. Plain, low-fat yogurt. Fats and oils Tub margarine without trans fats. Light or reduced-fat mayonnaise. Light or reduced-fat salad dressings. Avocado. Safflower, olive, sunflower, soybean, and canola oils. The items listed above may not be a complete list of recommended foods and beverages. Talk with your dietitian about what dietary choices are best for you. What foods should I avoid? Fruits Sweetened dried fruit. Canned fruit in syrup. Fruit juice. Vegetables Creamed or fried vegetables. Vegetables in a cheese sauce. Grains White bread. White (regular) pasta. White rice. Cornbread. Bagels. Pastries. Crackers that contain trans fat. Meats and other proteins Fatty cuts of meat. Ribs. Chicken wings. Carl Roberts. Sausage. Bologna. Salami. Chitterlings. Fatback. Hot dogs. Bratwurst. Packaged lunch meats. Dairy Whole or reduced-fat (2%) milk. Half-and-half. Cream cheese.  Full-fat or sweetened yogurt. Full-fat cheese. Nondairy creamers. Whipped toppings. Processed cheese or cheese spreads. Cheese curds. Fats and oils Butter. Stick margarine. Lard. Shortening. Ghee. Bacon fat. Tropical oils, such as coconut, palm kernel, or palm oils. Beverages Alcohol. Sweetened drinks, such as soda, lemonade, fruit drinks, or punches. Sweets and desserts Corn syrup. Sugars. Honey. Molasses. Candy. Jam and jelly. Syrup. Sweetened cereals. Cookies. Pies. Cakes. Donuts. Muffins. Ice cream. Condiments Store-bought sauces, dressings, and marinades that are high in sugar, such as ketchup and barbecue sauce. The items listed above may not be a complete list of foods and beverages you should avoid. Talk with your dietitian about what dietary choices are best for you. Summary High levels of triglycerides can increase the risk of heart disease and stroke. Choosing the right foods can help lower your triglycerides. Eat plenty of fresh fruits, vegetables, and whole grains. Choose low-fat dairy and lean meats. Eat fatty fish at least twice a week. Avoid processed and prepackaged foods with added sugar, sodium, saturated fat, and trans fat. If you need suggestions or have  questions about what types of food are good for you, talk with your health care provider or a dietitian. This information is not intended to replace advice given to you by your health care provider. Make sure you discuss any questions you have with your health care provider. Document Revised: 11/15/2020 Document Reviewed: 11/15/2020 Elsevier Patient Education  2024 [employer].

## 2023-12-20 NOTE — Progress Notes (Signed)
 Office Visit    Patient Name: Carl Roberts Date of Encounter: 12/20/2023  Primary Care Provider:  Judithann Novas, MD Primary Cardiologist:  None  Chief Complaint    Hyperlipidemia   Significant Past Medical History   CAD CAC 36 (was < 45, no percentile available; age 48 = 93rd)  HTN Controlled on lisinopril /hctz and amlodipine   obesity BMI 41.72; seeing weight loss clinic in [place]  OSA Compliant with CPAP        Allergies  Allergen Reactions   Naproxen Shortness Of Breath   Shellfish Allergy Shortness Of Breath and Itching    History of Present Illness    Carl Roberts is a 48 y.o. male patient of Dr Alda Amas, in the office today to discuss options for cholesterol management.  He has had high cholesterol for many years, with LDL as high as 172 in 2023.  He notes that both parents and both brothers all have elevated cholesterol as well.    Insurance Carrier: BCBS - Chartered certified accountant:   [employer]: no, copay card     LDL Cholesterol goal:  LDL < 70  Current Medications:  none   Previously tried:  rosuvastatin  - myalgias  Family Hx: both parents with high cholesterol, both brothers as well, grandfathers with MI; hypertension    Social Hx: Tobacco:  former smoker, quit in 2000 Alcohol:   bourbon, whiskey - usually 2 drinks per day    Accessory Clinical Findings   Lab Results  Component Value Date   CHOL 242 (H) 12/14/2023   HDL 44 12/14/2023   LDLCALC 159 (H) 12/14/2023   LDLDIRECT 168.0 06/24/2023   TRIG 211 (H) 12/14/2023   CHOLHDL 5.5 (H) 12/14/2023    No results found for: "LIPOA"  Lab Results  Component Value Date   ALT 39 09/27/2023   AST 11 09/27/2023   ALKPHOS 59 09/27/2023   BILITOT 0.5 09/27/2023   Lab Results  Component Value Date   CREATININE 0.86 09/27/2023   BUN 12 09/27/2023   NA 138 09/27/2023   K 4.0 09/27/2023   CL 102 09/27/2023   CO2 26 09/27/2023   Lab Results  Component Value Date   HGBA1C  4.9 07/01/2023    Home Medications    Current Outpatient Medications  Medication Sig Dispense Refill   amLODipine  (NORVASC ) 10 MG tablet Take 1 tablet (10 mg total) by mouth daily. 90 tablet 3   Cholecalciferol  (VITAMIN D3) 250 MCG (10000 UT) capsule Take 10,000 Units by mouth daily.     lisinopril -hydrochlorothiazide  (ZESTORETIC ) 20-12.5 MG tablet TAKE 2 TABLETS BY MOUTH EVERY DAY 180 tablet 1   NEEDLE, DISP, 25 G (BD HYPODERMIC NEEDLE) 25G X 1-1/2" MISC USE 1 EACH EVERY 14 (FOURTEEN) DAYS (FOR ADMINISTERING THE MEDICATION) 50 each 3   testosterone  cypionate (DEPOTESTOSTERONE CYPIONATE) 200 MG/ML injection INJECT 0.5 MLS (100 MG TOTAL) INTO THE MUSCLE ONCE A WEEK. (SINGLE USE VIALS) 4 mL 1   triamcinolone  cream (KENALOG ) 0.1 % APPLY 1 APPLICATION TOPICALLY TWICE A DAY AS NEEDED 30 g 0   No current facility-administered medications for this visit.     Assessment & Plan    Hyperlipidemia, mixed Assessment: Patient with ASCVD not at LDL goal of < 70 Most recent LDL 159 on 12/14/23 Not able to tolerate statins secondary to myalgias - rosuvastatin  Reviewed options for lowering LDL cholesterol, including ezetimibe, PCSK-9 inhibitors, bempedoic acid and inclisiran.  Discussed mechanisms of action, dosing, side effects, potential decreases  in LDL cholesterol and costs.  Also reviewed potential options for patient assistance.  Plan: Patient agreeable to starting Repatha 140 mg q14d Will work on dietary/lifestyle to decrease triglycerides - encouraged to cut back to 1 drink per day Repeat labs after:  3 months Lipid Liver function Patient was given information Repatha copay card.  If cost prohibitive, will consider Leqvio pricing as well.   Aryaa Bunting, PharmD CPP [facility] [address]  [place], [place] [address] [phone]  12/20/2023, 8:47 AM

## 2023-12-23 ENCOUNTER — Telehealth: Payer: Self-pay | Admitting: Gastroenterology

## 2023-12-23 ENCOUNTER — Encounter: Admitting: Gastroenterology

## 2023-12-23 DIAGNOSIS — Z1211 Encounter for screening for malignant neoplasm of colon: Secondary | ICD-10-CM

## 2023-12-23 MED ORDER — NA SULFATE-K SULFATE-MG SULF 17.5-3.13-1.6 GM/177ML PO SOLN: 1.0000 | Freq: Once | ORAL | 0 refills | Status: AC

## 2023-12-23 NOTE — Telephone Encounter (Signed)
 Good Morning Dr. Karene Oto,  I called this patient this morning at 7:15am  Patient stated he was reschedule and forgot the date however, he never picked up prep  I will NO SHOW him.  [employer]

## 2023-12-23 NOTE — Telephone Encounter (Signed)
 Called pt and rescheduled colonoscopy for Tuesday 02/15/24 at 10am. New instructions sent to pt via MyChart. Requested pt review and call back if any questions. Prescription for Suprep sent to pt's preferred pharmacy.

## 2024-01-24 DIAGNOSIS — H903 Sensorineural hearing loss, bilateral: Secondary | ICD-10-CM | POA: Diagnosis not present

## 2024-01-24 DIAGNOSIS — J392 Other diseases of pharynx: Secondary | ICD-10-CM | POA: Diagnosis not present

## 2024-02-11 ENCOUNTER — Telehealth: Payer: Self-pay | Admitting: Gastroenterology

## 2024-02-11 NOTE — Telephone Encounter (Signed)
 Good morning Dr. San, I received a call from this patient stating that he would like to reschedule do to him not having a care partner as well as having a scheduling conflict. Patient appointment was for July the 29 th but reschedule for September the 2 nd for his colonoscopy. Please advise.

## 2024-02-15 ENCOUNTER — Encounter: Admitting: Gastroenterology

## 2024-02-23 ENCOUNTER — Other Ambulatory Visit: Payer: Self-pay | Admitting: Family Medicine

## 2024-02-23 DIAGNOSIS — E291 Testicular hypofunction: Secondary | ICD-10-CM

## 2024-02-23 NOTE — Telephone Encounter (Signed)
 Last office visit 07/01/2023 for CPE.  Last refilled 07/29/2023 for 4 ml with 1 refill.  Testosterone  level 06/24/23 which was normal at 358.40 ng/dL.  Next Appt: No future appointments with PCP.

## 2024-02-24 DIAGNOSIS — H43312 Vitreous membranes and strands, left eye: Secondary | ICD-10-CM | POA: Diagnosis not present

## 2024-03-02 DIAGNOSIS — M1711 Unilateral primary osteoarthritis, right knee: Secondary | ICD-10-CM | POA: Diagnosis not present

## 2024-03-17 ENCOUNTER — Encounter: Payer: Self-pay | Admitting: Gastroenterology

## 2024-03-21 ENCOUNTER — Encounter: Admitting: Gastroenterology

## 2024-04-21 ENCOUNTER — Other Ambulatory Visit: Payer: Self-pay | Admitting: Family Medicine

## 2024-04-21 NOTE — Telephone Encounter (Signed)
 Last office visit 07/01/23 for CPE.  Last refilled 11/03/23 for 30 g with no refills.  Next appt: No future appointments with PCP.

## 2024-04-26 ENCOUNTER — Ambulatory Visit (AMBULATORY_SURGERY_CENTER)

## 2024-04-26 VITALS — Ht 73.0 in | Wt 319.0 lb

## 2024-04-26 DIAGNOSIS — Z1211 Encounter for screening for malignant neoplasm of colon: Secondary | ICD-10-CM

## 2024-04-26 MED ORDER — NA SULFATE-K SULFATE-MG SULF 17.5-3.13-1.6 GM/177ML PO SOLN: 1.0000 | Freq: Once | ORAL | 0 refills | Status: AC

## 2024-04-26 NOTE — Progress Notes (Signed)

## 2024-05-03 ENCOUNTER — Encounter: Payer: Self-pay | Admitting: Gastroenterology

## 2024-05-05 ENCOUNTER — Telehealth: Payer: Self-pay | Admitting: Gastroenterology

## 2024-05-05 NOTE — Telephone Encounter (Signed)
 Inbound call from patient stating he has an upcoming procedure on 05/10/24 and would like to know how much his insurance would cover.  Requesting a call back  Please advise  Thank you

## 2024-05-08 ENCOUNTER — Telehealth: Payer: Self-pay | Admitting: Gastroenterology

## 2024-05-08 NOTE — Telephone Encounter (Signed)
 Good morning Dr. San,   Patient requesting to reschedule 10/22 colonoscopy due to missing work over the weekend due to his son getting into a accident. States since he missed this weekend he is unable to take off 10/22 for appointment. Patient rescheduled to 12/3.   Thank you

## 2024-05-10 ENCOUNTER — Encounter: Admitting: Gastroenterology

## 2024-06-19 ENCOUNTER — Telehealth: Payer: Self-pay | Admitting: Gastroenterology

## 2024-06-19 NOTE — Telephone Encounter (Signed)
 Patient called and rescheduled colonoscopy for day after tomorrow. He had not been restricting his diet. He was rescheduled for 07/26/24 and new instructions sent through MyChart.

## 2024-06-19 NOTE — Telephone Encounter (Signed)
 Patient needing updated prep instructions and to be further advised.

## 2024-06-21 ENCOUNTER — Encounter: Admitting: Gastroenterology

## 2024-06-29 ENCOUNTER — Telehealth: Payer: Self-pay

## 2024-06-29 NOTE — Telephone Encounter (Signed)
 Pt needs CPE scheduled with Dr [name]. She will order labs prior to the appt. His CPE is due after 07-01-24. Please help him get scheduled. Thank you

## 2024-06-30 ENCOUNTER — Telehealth: Admitting: Family Medicine

## 2024-06-30 DIAGNOSIS — B9689 Other specified bacterial agents as the cause of diseases classified elsewhere: Secondary | ICD-10-CM | POA: Diagnosis not present

## 2024-06-30 DIAGNOSIS — J019 Acute sinusitis, unspecified: Secondary | ICD-10-CM | POA: Diagnosis not present

## 2024-06-30 DIAGNOSIS — J40 Bronchitis, not specified as acute or chronic: Secondary | ICD-10-CM | POA: Diagnosis not present

## 2024-06-30 MED ORDER — AMOXICILLIN-POT CLAVULANATE 875-125 MG PO TABS: 1.0000 | ORAL_TABLET | Freq: Two times a day (BID) | ORAL | 0 refills | Status: AC

## 2024-06-30 MED ORDER — PROMETHAZINE-DM 6.25-15 MG/5ML PO SYRP: 5.0000 mL | ORAL_SOLUTION | Freq: Four times a day (QID) | ORAL | 0 refills | Status: AC | PRN

## 2024-06-30 NOTE — Patient Instructions (Signed)

## 2024-06-30 NOTE — Progress Notes (Signed)
 Virtual Visit Consent   [name], you are scheduled for a virtual visit with a  provider today. Just as with appointments in the office, your consent must be obtained to participate. Your consent will be active for this visit and any virtual visit you may have with one of our providers in the next 365 days. If you have a MyChart account, a copy of this consent can be sent to you electronically.  As this is a virtual visit, video technology does not allow for your provider to perform a traditional examination. This may limit your provider's ability to fully assess your condition. If your provider identifies any concerns that need to be evaluated in person or the need to arrange testing (such as labs, EKG, etc.), we will make arrangements to do so. Although advances in technology are sophisticated, we cannot ensure that it will always work on either your end or our end. If the connection with a video visit is poor, the visit may have to be switched to a telephone visit. With either a video or telephone visit, we are not always able to ensure that we have a secure connection.  By engaging in this virtual visit, you consent to the provision of healthcare and authorize for your insurance to be billed (if applicable) for the services provided during this visit. Depending on your insurance coverage, you may receive a charge related to this service.  I need to obtain your verbal consent now. Are you willing to proceed with your visit today? [name] has provided verbal consent on 06/30/2024 for a virtual visit (video or telephone). [name], FNP  Date: 06/30/2024 11:26 AM   Virtual Visit via Video Note   I, [name], connected with  Carl Roberts  ([id number], November 03, 1975) on 06/30/2024 at 11:30 AM EST by a video-enabled telemedicine application and verified that I am speaking with the correct person using two identifiers.  Location: Patient: Virtual Visit Location  Patient: Home Provider: Virtual Visit Location Provider: Home Office   I discussed the limitations of evaluation and management by telemedicine and the availability of in person appointments. The patient expressed understanding and agreed to proceed.    History of Present Illness: Carl Roberts is a 48 y.o. who identifies as a male who was assigned male at birth, and is being seen today for chest congestion starting 2 weeks ago now worsening into sinus pain and pressure, thick sinus congestion, facial pressure and pain. Mucus yellow and green, headaches, no fever, no wheezing or sob. Cough worse at night. [name]  HPI: HPI  Problems:  Patient Active Problem List   Diagnosis Date Noted   Recurrent acute suppurative otitis media without spontaneous rupture of left tympanic membrane 03/19/2022   Right otitis media with effusion 12/25/2021   Vitamin D  deficiency 11/18/2021   Fatty liver 11/06/2021   Essential hypertension 05/06/2020   Morbid obesity (HCC) 02/07/2020   Hypogonadism in male 08/03/2018   Hyperlipidemia, mixed 09/08/2016   OSA on CPAP 10/27/2011   Childhood asthma without complication 04/17/2011    Allergies: Allergies[1] Medications: Current Medications[2]  Observations/Objective: Patient is well-developed, well-nourished in no acute distress.  Resting comfortably  at home.  Head is normocephalic, atraumatic.  No labored breathing.  Speech is clear and coherent with logical content.  Patient is alert and oriented at baseline.    Assessment and Plan: 1. Acute bacterial sinusitis (Primary)  2. Bronchitis  Increase fluids, humidifier at night, tylenol or ibuprofen, UC  as needed. Mucinex, flonase.   Follow Up Instructions: I discussed the assessment and treatment plan with the patient. The patient was provided an opportunity to ask questions and all were answered. The patient agreed with the plan and demonstrated an understanding of the instructions.  A copy of  instructions were sent to the patient via MyChart unless otherwise noted below.     The patient was advised to call back or seek an in-person evaluation if the symptoms worsen or if the condition fails to improve as anticipated.    [name], FNP      [1]  Allergies Allergen Reactions   Naproxen Shortness Of Breath   Shellfish Allergy Shortness Of Breath and Itching  [2]  Current Outpatient Medications:    amoxicillin -clavulanate (AUGMENTIN ) 875-125 MG tablet, Take 1 tablet by mouth 2 (two) times daily for 10 days., Disp: 20 tablet, Rfl: 0   promethazine-dextromethorphan (PROMETHAZINE-DM) 6.25-15 MG/5ML syrup, Take 5 mLs by mouth 4 (four) times daily as needed for up to 10 days for cough., Disp: 118 mL, Rfl: 0   amLODipine  (NORVASC ) 10 MG tablet, Take 1 tablet (10 mg total) by mouth daily., Disp: 90 tablet, Rfl: 3   B Complex Vitamins (VITAMIN B COMPLEX PO), Take 1 capsule by mouth daily., Disp: , Rfl:    celecoxib (CELEBREX) 200 MG capsule, Take 200 mg by mouth 3 (three) times daily., Disp: , Rfl:    Cholecalciferol  (VITAMIN D3) 250 MCG (10000 UT) capsule, Take 10,000 Units by mouth daily., Disp: , Rfl:    co-enzyme Q-10 30 MG capsule, Take 30 mg by mouth daily., Disp: , Rfl:    lisinopril -hydrochlorothiazide  (ZESTORETIC ) 20-12.5 MG tablet, TAKE 2 TABLETS BY MOUTH EVERY DAY, Disp: 180 tablet, Rfl: 1   NEEDLE, DISP, 25 G (BD HYPODERMIC NEEDLE) 25G X 1-1/2 MISC, USE 1 EACH EVERY 14 (FOURTEEN) DAYS (FOR ADMINISTERING THE MEDICATION), Disp: 50 each, Rfl: 3   Red Yeast Rice Extract (RED YEAST RICE PO), Take 1 capsule by mouth daily., Disp: , Rfl:    testosterone  cypionate (DEPOTESTOSTERONE CYPIONATE) 200 MG/ML injection, INJECT 0.5 MLS (100 MG TOTAL) INTO THE MUSCLE ONCE A WEEK. (SINGLE USE VIALS), Disp: 4 mL, Rfl: 1   triamcinolone  cream (KENALOG ) 0.1 %, APPLY 1 APPLICATION TOPICALLY TWICE A DAY AS NEEDED, Disp: 30 g, Rfl: 0

## 2024-07-04 NOTE — Telephone Encounter (Signed)
 Called and schedule pt for Cpe / labs

## 2024-07-05 ENCOUNTER — Telehealth: Payer: Self-pay | Admitting: *Deleted

## 2024-07-05 DIAGNOSIS — E291 Testicular hypofunction: Secondary | ICD-10-CM

## 2024-07-05 DIAGNOSIS — E559 Vitamin D deficiency, unspecified: Secondary | ICD-10-CM

## 2024-07-05 DIAGNOSIS — E782 Mixed hyperlipidemia: Secondary | ICD-10-CM

## 2024-07-05 NOTE — Telephone Encounter (Signed)
-----   Message from Veva JINNY Ferrari sent at 07/05/2024  3:31 PM EST ----- Regarding: Lab orders for Wed, 12.31.25 Patient is scheduled for CPX labs, please order future labs, Thanks , Veva

## 2024-07-19 ENCOUNTER — Other Ambulatory Visit

## 2024-07-21 ENCOUNTER — Other Ambulatory Visit: Payer: Self-pay | Admitting: Family Medicine

## 2024-07-21 DIAGNOSIS — I10 Essential (primary) hypertension: Secondary | ICD-10-CM

## 2024-07-25 ENCOUNTER — Telehealth: Admitting: Physician Assistant

## 2024-07-25 ENCOUNTER — Other Ambulatory Visit

## 2024-07-25 DIAGNOSIS — B9689 Other specified bacterial agents as the cause of diseases classified elsewhere: Secondary | ICD-10-CM

## 2024-07-25 DIAGNOSIS — J208 Acute bronchitis due to other specified organisms: Secondary | ICD-10-CM

## 2024-07-25 MED ORDER — BENZONATATE 100 MG PO CAPS: 100.0000 mg | ORAL_CAPSULE | Freq: Three times a day (TID) | ORAL | 0 refills | Status: AC | PRN

## 2024-07-25 MED ORDER — AZITHROMYCIN 250 MG PO TABS: ORAL_TABLET | ORAL | 0 refills | Status: AC

## 2024-07-25 NOTE — Progress Notes (Signed)
 We are sorry that you are not feeling well.  Here is how we plan to help!  Based on your presentation I believe you most likely have A cough due to bacteria.  When patients have a fever and a productive cough with a change in color or increased sputum production, we are concerned about bacterial bronchitis.  If left untreated it can progress to pneumonia.  If your symptoms do not improve with your treatment plan it is important that you contact your provider.   I have prescribed Azithromyin 250 mg: two tablets now and then one tablet daily for 4 additonal days    In addition you may use A prescription cough medication called Tessalon  Perles 100mg . You may take 1-2 capsules every 8 hours as needed for your cough.  From your responses in the eVisit questionnaire you describe inflammation in the upper respiratory tract which is causing a significant cough.  This is commonly called Bronchitis and has four common causes:   Allergies Viral Infections Acid Reflux Bacterial Infection Allergies, viruses and acid reflux are treated by controlling symptoms or eliminating the cause. An example might be a cough caused by taking certain blood pressure medications. You stop the cough by changing the medication. Another example might be a cough caused by acid reflux. Controlling the reflux helps control the cough.  USE OF BRONCHODILATOR (RESCUE) INHALERS: There is a risk from using your bronchodilator too frequently.  The risk is that over-reliance on a medication which only relaxes the muscles surrounding the breathing tubes can reduce the effectiveness of medications prescribed to reduce swelling and congestion of the tubes themselves.  Although you feel brief relief from the bronchodilator inhaler, your asthma may actually be worsening with the tubes becoming more swollen and filled with mucus.  This can delay other crucial treatments, such as oral steroid medications. If you need to use a bronchodilator inhaler  daily, several times per day, you should discuss this with your provider.  There are probably better treatments that could be used to keep your asthma under control.     HOME CARE Only take medications as instructed by your medical team. Complete the entire course of an antibiotic. Drink plenty of fluids and get plenty of rest. Avoid close contacts especially the very young and the elderly Cover your mouth if you cough or cough into your sleeve. Always remember to wash your hands A steam or ultrasonic humidifier can help congestion.   GET HELP RIGHT AWAY IF: You develop worsening fever. You become short of breath You cough up blood. Your symptoms persist after you have completed your treatment plan MAKE SURE YOU  Understand these instructions. Will watch your condition. Will get help right away if you are not doing well or get worse.  Your e-visit answers were reviewed by a board certified advanced clinical practitioner to complete your personal care plan.  Depending on the condition, your plan could have included both over the counter or prescription medications. If there is a problem please reply  once you have received a response from your provider. Your safety is important to us .  If you have drug allergies check your prescription carefully.    You can use MyChart to ask questions about today's visit, request a non-urgent call back, or ask for a work or school excuse for 24 hours related to this e-Visit. If it has been greater than 24 hours you will need to follow up with your provider, or enter a new e-Visit to  address those concerns. You will get an e-mail in the next two days asking about your experience.  I hope that your e-visit has been valuable and will speed your recovery. Thank you for using e-visits.   I have spent 5 minutes in review of e-visit questionnaire, review and updating patient chart, medical decision making and response to patient.   Elsie Velma Lunger,  PA-C

## 2024-07-26 ENCOUNTER — Encounter: Admitting: Family Medicine

## 2024-07-26 ENCOUNTER — Ambulatory Visit: Payer: Self-pay | Admitting: Family Medicine

## 2024-07-26 ENCOUNTER — Other Ambulatory Visit (INDEPENDENT_AMBULATORY_CARE_PROVIDER_SITE_OTHER)

## 2024-07-26 ENCOUNTER — Encounter: Admitting: Gastroenterology

## 2024-07-26 DIAGNOSIS — E559 Vitamin D deficiency, unspecified: Secondary | ICD-10-CM | POA: Diagnosis not present

## 2024-07-26 DIAGNOSIS — E782 Mixed hyperlipidemia: Secondary | ICD-10-CM | POA: Diagnosis not present

## 2024-07-26 DIAGNOSIS — E291 Testicular hypofunction: Secondary | ICD-10-CM | POA: Diagnosis not present

## 2024-07-26 LAB — COMPREHENSIVE METABOLIC PANEL WITH GFR
ALT: 51 U/L (ref 3–53)
AST: 13 U/L (ref 5–37)
Albumin: 4.7 g/dL (ref 3.5–5.2)
Alkaline Phosphatase: 69 U/L (ref 39–117)
BUN: 16 mg/dL (ref 6–23)
CO2: 27 meq/L (ref 19–32)
Calcium: 9.9 mg/dL (ref 8.4–10.5)
Chloride: 99 meq/L (ref 96–112)
Creatinine, Ser: 0.87 mg/dL (ref 0.40–1.50)
GFR: 103.31 mL/min
Glucose, Bld: 92 mg/dL (ref 70–99)
Potassium: 4.4 meq/L (ref 3.5–5.1)
Sodium: 135 meq/L (ref 135–145)
Total Bilirubin: 0.7 mg/dL (ref 0.2–1.2)
Total Protein: 7.6 g/dL (ref 6.0–8.3)

## 2024-07-26 LAB — TESTOSTERONE: Testosterone: 94.84 ng/dL — ABNORMAL LOW (ref 300.00–890.00)

## 2024-07-26 LAB — LIPID PANEL
Cholesterol: 248 mg/dL — ABNORMAL HIGH (ref 28–200)
HDL: 49 mg/dL
LDL Cholesterol: 154 mg/dL — ABNORMAL HIGH (ref 10–99)
NonHDL: 199.33
Total CHOL/HDL Ratio: 5
Triglycerides: 228 mg/dL — ABNORMAL HIGH (ref 10.0–149.0)
VLDL: 45.6 mg/dL — ABNORMAL HIGH (ref 0.0–40.0)

## 2024-07-26 LAB — VITAMIN D 25 HYDROXY (VIT D DEFICIENCY, FRACTURES): VITD: 22.55 ng/mL — ABNORMAL LOW (ref 30.00–100.00)

## 2024-07-26 NOTE — Progress Notes (Signed)
 No critical labs need to be addressed urgently. We will discuss labs in detail at upcoming office visit.

## 2024-08-03 ENCOUNTER — Encounter: Admitting: Family Medicine

## 2024-08-03 ENCOUNTER — Encounter: Admitting: Gastroenterology

## 2024-08-08 ENCOUNTER — Encounter: Payer: Self-pay | Admitting: Gastroenterology

## 2024-08-16 ENCOUNTER — Ambulatory Visit: Admitting: Gastroenterology

## 2024-08-16 ENCOUNTER — Encounter: Payer: Self-pay | Admitting: Gastroenterology

## 2024-08-16 VITALS — BP 118/98 | HR 68 | Temp 97.3°F | Resp 11 | Ht 73.0 in | Wt 319.0 lb

## 2024-08-16 DIAGNOSIS — K5730 Diverticulosis of large intestine without perforation or abscess without bleeding: Secondary | ICD-10-CM | POA: Diagnosis not present

## 2024-08-16 DIAGNOSIS — K635 Polyp of colon: Secondary | ICD-10-CM | POA: Diagnosis not present

## 2024-08-16 DIAGNOSIS — K641 Second degree hemorrhoids: Secondary | ICD-10-CM | POA: Diagnosis not present

## 2024-08-16 DIAGNOSIS — D120 Benign neoplasm of cecum: Secondary | ICD-10-CM

## 2024-08-16 DIAGNOSIS — D125 Benign neoplasm of sigmoid colon: Secondary | ICD-10-CM | POA: Diagnosis not present

## 2024-08-16 DIAGNOSIS — Z1211 Encounter for screening for malignant neoplasm of colon: Secondary | ICD-10-CM | POA: Diagnosis present

## 2024-08-16 MED ORDER — SODIUM CHLORIDE 0.9 % IV SOLN: 500.0000 mL | Freq: Once | INTRAVENOUS | Status: DC

## 2024-08-16 NOTE — Progress Notes (Signed)
 Pt's states no medical or surgical changes since previsit or office visit.

## 2024-08-16 NOTE — Progress Notes (Signed)
 Vss nad trans to pacu

## 2024-08-16 NOTE — Patient Instructions (Signed)
 Resume previous diet and medications. Awaiting pathology results, repeat colonoscopy date to be determined based on pathology results. Handouts provided on Colon polyps, Diverticulosis and Hemorrhoids  YOU HAD AN ENDOSCOPIC PROCEDURE TODAY AT THE [facility]:   Refer to the procedure report that was given to you for any specific questions about what was found during the examination.  If the procedure report does not answer your questions, please call your gastroenterologist to clarify.  If you requested that your care partner not be given the details of your procedure findings, then the procedure report has been included in a sealed envelope for you to review at your convenience later.  YOU SHOULD EXPECT: Some feelings of bloating in the abdomen. Passage of more gas than usual.  Walking can help get rid of the air that was put into your GI tract during the procedure and reduce the bloating. If you had a lower endoscopy (such as a colonoscopy or flexible sigmoidoscopy) you may notice spotting of blood in your stool or on the toilet paper. If you underwent a bowel prep for your procedure, you may not have a normal bowel movement for a few days.  Please Note:  You might notice some irritation and congestion in your nose or some drainage.  This is from the oxygen used during your procedure.  There is no need for concern and it should clear up in a day or so.  SYMPTOMS TO REPORT IMMEDIATELY:  Following lower endoscopy (colonoscopy or flexible sigmoidoscopy):  Excessive amounts of blood in the stool  Significant tenderness or worsening of abdominal pains  Swelling of the abdomen that is new, acute  Fever of 100F or higher  For urgent or emergent issues, a gastroenterologist can be reached at any hour by calling [phone]. Do not use MyChart messaging for urgent concerns.    DIET:  We do recommend a small meal at first, but then you may proceed to your regular diet.  Drink plenty of  fluids but you should avoid alcoholic beverages for 24 hours.  ACTIVITY:  You should plan to take it easy for the rest of today and you should NOT DRIVE or use heavy machinery until tomorrow (because of the sedation medicines used during the test).    FOLLOW UP: Our staff will call the number listed on your records the next business day following your procedure.  We will call around 7:15- 8:00 am to check on you and address any questions or concerns that you may have regarding the information given to you following your procedure. If we do not reach you, we will leave a message.     If any biopsies were taken you will be contacted by phone or by letter within the next 1-3 weeks.  Please call us  at [phone] if you have not heard about the biopsies in 3 weeks.    SIGNATURES/CONFIDENTIALITY: You and/or your care partner have signed paperwork which will be entered into your electronic medical record.  These signatures attest to the fact that that the information above on your After Visit Summary has been reviewed and is understood.  Full responsibility of the confidentiality of this discharge information lies with you and/or your care-partner.

## 2024-08-16 NOTE — Progress Notes (Signed)
 "   GASTROENTEROLOGY PROCEDURE H&P NOTE   Primary Care Physician: [name] Greig BRAVO, MD    Reason for Procedure:  Colon Cancer screening  Plan:    Colonoscopy  Patient is appropriate for endoscopic procedure(s) in the ambulatory ([facility]) setting.  The nature of the procedure, as well as the risks, benefits, and alternatives were carefully and thoroughly reviewed with the patient. Ample time for discussion and questions allowed. The patient understood, was satisfied, and agreed to proceed. I personally addressed all patient questions and concerns.     HPI: Carl Roberts is a 49 y.o. male who presents for colonoscopy for routine Colon Cancer screening.  No active GI symptoms.  No known family history of colon cancer or related malignancy.  Patient is otherwise without complaints or active issues today.  Past Medical History:  Diagnosis Date   Allergy    Asthma    Hyperlipidemia    Hypertension    Low testosterone     Pituitary adenoma (HCC) 10/26/2011   Overview:  Last MRI normal 10/2011   Sleep apnea    Stomach ulcer     Past Surgical History:  Procedure Laterality Date   BACK SURGERY      Prior to Admission medications  Medication Sig Start Date End Date Taking? Authorizing Provider  amLODipine  (NORVASC ) 10 MG tablet Take 1 tablet (10 mg total) by mouth daily. 08/31/23 04/26/24  Kate Lonni LITTIE, MD  B Complex Vitamins (VITAMIN B COMPLEX PO) Take 1 capsule by mouth daily.    [provider]  benzonatate  (TESSALON ) 100 MG capsule Take 1 capsule (100 mg total) by mouth 3 (three) times daily as needed for cough. 07/25/24   Gladis Elsie BROCKS, PA-C  celecoxib (CELEBREX) 200 MG capsule Take 200 mg by mouth 3 (three) times daily. 03/02/24   [provider]  Cholecalciferol  (VITAMIN D3) 250 MCG (10000 UT) capsule Take 10,000 Units by mouth daily.    [provider]  co-enzyme Q-10 30 MG capsule Take 30 mg by mouth daily.    [provider]   lisinopril -hydrochlorothiazide  (ZESTORETIC ) 20-12.5 MG tablet TAKE 2 TABLETS BY MOUTH EVERY DAY 07/21/24   Bedsole, [name], MD  NEEDLE, DISP, 25 G (BD HYPODERMIC NEEDLE) 25G X 1-1/2 MISC USE 1 EACH EVERY 14 (FOURTEEN) DAYS (FOR ADMINISTERING THE MEDICATION) 11/30/19   Velma Raisin, MD  Red Yeast Rice Extract (RED YEAST RICE PO) Take 1 capsule by mouth daily.    [provider]  testosterone  cypionate (DEPOTESTOSTERONE CYPIONATE) 200 MG/ML injection INJECT 0.5 MLS (100 MG TOTAL) INTO THE MUSCLE ONCE A WEEK. (SINGLE USE VIALS) 02/24/24   Bedsole, [name], MD  triamcinolone  cream (KENALOG ) 0.1 % APPLY 1 APPLICATION TOPICALLY TWICE A DAY AS NEEDED 04/21/24   [name], [name], MD    Current Outpatient Medications  Medication Sig Dispense Refill   amLODipine  (NORVASC ) 10 MG tablet Take 1 tablet (10 mg total) by mouth daily. 90 tablet 3   B Complex Vitamins (VITAMIN B COMPLEX PO) Take 1 capsule by mouth daily.     benzonatate  (TESSALON ) 100 MG capsule Take 1 capsule (100 mg total) by mouth 3 (three) times daily as needed for cough. 30 capsule 0   celecoxib (CELEBREX) 200 MG capsule Take 200 mg by mouth 3 (three) times daily.     Cholecalciferol  (VITAMIN D3) 250 MCG (10000 UT) capsule Take 10,000 Units by mouth daily.     co-enzyme Q-10 30 MG capsule Take 30 mg by mouth daily.  lisinopril -hydrochlorothiazide  (ZESTORETIC ) 20-12.5 MG tablet TAKE 2 TABLETS BY MOUTH EVERY DAY 180 tablet 0   NEEDLE, DISP, 25 G (BD HYPODERMIC NEEDLE) 25G X 1-1/2 MISC USE 1 EACH EVERY 14 (FOURTEEN) DAYS (FOR ADMINISTERING THE MEDICATION) 50 each 3   Red Yeast Rice Extract (RED YEAST RICE PO) Take 1 capsule by mouth daily.     testosterone  cypionate (DEPOTESTOSTERONE CYPIONATE) 200 MG/ML injection INJECT 0.5 MLS (100 MG TOTAL) INTO THE MUSCLE ONCE A WEEK. (SINGLE USE VIALS) 4 mL 1   triamcinolone  cream (KENALOG ) 0.1 % APPLY 1 APPLICATION TOPICALLY TWICE A DAY AS NEEDED 30 g 0   No current facility-administered medications  for this visit.    Allergies as of 08/16/2024 - Review Complete 07/25/2024  Allergen Reaction Noted   Naproxen Shortness Of Breath 08/03/2018   Shellfish allergy Shortness Of Breath and Itching 09/07/2016    Family History  Problem Relation Age of Onset   Arthritis Mother    Diabetes Mother    Arthritis Father    Glaucoma Father    Hypertension Brother    Hyperlipidemia Brother    Asthma Maternal Grandmother    Lung cancer Maternal Grandmother    COPD Maternal Grandfather    Heart disease Maternal Grandfather    Hyperlipidemia Maternal Grandfather    Heart attack Maternal Grandfather 84   Alzheimer's disease Paternal Grandmother    Parkinson's disease Paternal Grandfather    Heart attack Paternal Grandfather 72   Allergies Son    Colon cancer Neg Hx    Esophageal cancer Neg Hx    Rectal cancer Neg Hx    Stomach cancer Neg Hx     Social History   Socioeconomic History   Marital status: Married    Spouse name: [name]   Number of children: 2   Years of education: high school, some college   Highest education level: Not on file  Occupational History   Not on file  Tobacco Use   Smoking status: Former    Current packs/day: 0.00    Average packs/day: 0.5 packs/day for 4.0 years (2.0 ttl pk-yrs)    Types: Cigarettes, Pipe    Start date: 08/03/1994    Quit date: 08/03/1998    Years since quitting: 26.0   Smokeless tobacco: Never   Tobacco comments:    Cigar every now and then  Vaping Use   Vaping status: Never Used  Substance and Sexual Activity   Alcohol use: Yes    Comment: 1 glass of wine, liquor on the weekends   Drug use: No   Sexual activity: Yes    Birth control/protection: None  Other Topics Concern   Not on file  Social History Narrative   Lives with wife, Carl Roberts   2 children   Exercise: tries to get 3 days a week, was running 2 times a week   Social Drivers of Health   Tobacco Use: Medium Risk (06/30/2024)   Patient History    Smoking Tobacco  Use: Former    Smokeless Tobacco Use: Never    Passive Exposure: Not on Actuary Strain: Not on file  Food Insecurity: Not on file  Transportation Needs: Not on file  Physical Activity: Not on file  Stress: Not on file  Social Connections: Unknown (11/17/2021)   Received from [facility]   Social Network    Social Network: Not on file  Intimate Partner Violence: Unknown (10/23/2021)   Received from [facility]   HITS    Physically  Hurt: Not on file    Insult or Talk Down To: Not on file    Threaten Physical Harm: Not on file    Scream or Curse: Not on file  Depression (PHQ2-9): Low Risk (07/01/2023)   Depression (PHQ2-9)    PHQ-2 Score: 2  Alcohol Screen: Not on file  Housing: Not on file  Utilities: Not on file  Health Literacy: Not on file    Physical Exam: Vital signs in last 24 hours: @There  were no vitals taken for this visit. GEN: NAD EYE: Sclerae anicteric ENT: MMM CV: Non-tachycardic Pulm: CTA b/l GI: Soft, NT/ND NEURO:  Alert & Oriented x 3   Sandor Flatter, DO [facility]   08/16/2024 9:25 AM  "

## 2024-08-16 NOTE — Op Note (Signed)
 [facility] Patient Name: Carl Roberts Procedure Date: 08/16/2024 9:54 AM MRN: [id number] Endoscopist: Sandor Flatter , MD, [id number] Age: 49 Referring MD:  Date of Birth: 05/25/1976 Gender: Male Account #: [id number] Procedure:                Colonoscopy Indications:              Screening for colorectal malignant neoplasm, This                            is the patient's first colonoscopy Medicines:                Monitored Anesthesia Care Procedure:                Pre-Anesthesia Assessment:                           - Prior to the procedure, a History and Physical                            was performed, and patient medications and                            allergies were reviewed. The patient's tolerance of                            previous anesthesia was also reviewed. The risks                            and benefits of the procedure and the sedation                            options and risks were discussed with the patient.                            All questions were answered, and informed consent                            was obtained. Prior Anticoagulants: The patient has                            taken no anticoagulant or antiplatelet agents. ASA                            Grade Assessment: III - A patient with severe                            systemic disease. After reviewing the risks and                            benefits, the patient was deemed in satisfactory                            condition to undergo the procedure.  After obtaining informed consent, the colonoscope                            was passed under direct vision. Throughout the                            procedure, the patient's blood pressure, pulse, and                            oxygen saturations were monitored continuously. The                            CF [id number] #[id number] was introduced through the anus                            and advanced to  the the cecum, identified by                            appendiceal orifice and ileocecal valve. The                            colonoscopy was performed without difficulty. The                            patient tolerated the procedure well. The quality                            of the bowel preparation was good. The ileocecal                            valve, appendiceal orifice, and rectum were                            photographed. Scope In: 10:07:09 AM Scope Out: 10:25:50 AM Scope Withdrawal Time: 0 hours 16 minutes 33 seconds  Total Procedure Duration: 0 hours 18 minutes 41 seconds  Findings:                 The perianal and digital rectal examinations were                            normal.                           A 2 mm polyp was found in the cecum. The polyp was                            sessile. The polyp was removed with a cold snare.                            Resection and retrieval were complete. Estimated                            blood loss was minimal.  An 11 mm polyp was found in the sigmoid colon. The                            polyp was pedunculated. The polyp was removed with                            a hot snare and removed en bloc. Resection and                            retrieval were complete. Estimated blood loss was                            minimal.                           Multiple large-mouthed and small-mouthed                            diverticula were found in the sigmoid colon,                            ascending colon and cecum.                           Non-bleeding internal hemorrhoids were found during                            retroflexion. The hemorrhoids were small and Grade                            II (internal hemorrhoids that prolapse but reduce                            spontaneously). Complications:            No immediate complications. Estimated Blood Loss:     Estimated blood loss was  minimal. Impression:               - One 2 mm polyp in the cecum, removed with a cold                            snare. Resected and retrieved.                           - One 11 mm polyp in the sigmoid colon, removed                            with a hot snare. Resected and retrieved.                           - Diverticulosis in the sigmoid colon, in the                            ascending colon and in the cecum.                           -  Non-bleeding internal hemorrhoids. Recommendation:           - Patient has a contact number available for                            emergencies. The signs and symptoms of potential                            delayed complications were discussed with the                            patient. Return to normal activities tomorrow.                            Written discharge instructions were provided to the                            patient.                           - Resume previous diet.                           - Continue present medications.                           - Await pathology results.                           - Repeat colonoscopy for surveillance based on                            pathology results.                           - Return to GI clinic PRN. Sandor Flatter, MD 08/16/2024 10:32:31 AM

## 2024-08-16 NOTE — Progress Notes (Signed)
 Called to room to assist during endoscopic procedure.  Patient ID and intended procedure confirmed with present staff. Received instructions for my participation in the procedure from the performing physician.

## 2024-08-17 ENCOUNTER — Telehealth: Payer: Self-pay | Admitting: *Deleted

## 2024-08-17 NOTE — Telephone Encounter (Signed)
" °  Follow up Call-     08/16/2024    9:36 AM  Call back number  Post procedure Call Back phone  # [phone]  Permission to leave phone message Yes     Patient questions:  Do you have a fever, pain , or abdominal swelling? No. Pain Score  0 *  Have you tolerated food without any problems? Yes.    Have you been able to return to your normal activities? Yes.    Do you have any questions about your discharge instructions: Diet   No. Medications  No. Follow up visit  No.  Do you have questions or concerns about your Care? No.  Actions: * If pain score is 4 or above: No action needed, pain <4.   "

## 2024-08-18 LAB — SURGICAL PATHOLOGY

## 2024-08-25 ENCOUNTER — Ambulatory Visit: Payer: Self-pay | Admitting: Gastroenterology
